# Patient Record
Sex: Female | Born: 1955 | Race: Black or African American | Hispanic: No | Marital: Single | Smoking: Former smoker
Health system: Southern US, Community
[De-identification: ages and names within clinical notes are randomized; demographics above are authoritative.]

## PROBLEM LIST (undated history)

## (undated) DIAGNOSIS — E119 Type 2 diabetes mellitus without complications: Secondary | ICD-10-CM

## (undated) DIAGNOSIS — B1920 Unspecified viral hepatitis C without hepatic coma: Secondary | ICD-10-CM

## (undated) DIAGNOSIS — M1990 Unspecified osteoarthritis, unspecified site: Secondary | ICD-10-CM

## (undated) DIAGNOSIS — M797 Fibromyalgia: Secondary | ICD-10-CM

## (undated) DIAGNOSIS — B009 Herpesviral infection, unspecified: Secondary | ICD-10-CM

## (undated) DIAGNOSIS — J449 Chronic obstructive pulmonary disease, unspecified: Secondary | ICD-10-CM

## (undated) HISTORY — PX: ABDOMINAL HYSTERECTOMY: SHX81

---

## 1998-08-22 ENCOUNTER — Emergency Department (HOSPITAL_COMMUNITY): Admission: EM | Admit: 1998-08-22 | Discharge: 1998-08-22 | Payer: Self-pay | Admitting: Emergency Medicine

## 2007-12-28 ENCOUNTER — Ambulatory Visit: Payer: Self-pay | Admitting: Gastroenterology

## 2008-04-02 ENCOUNTER — Ambulatory Visit: Payer: Self-pay | Admitting: Gastroenterology

## 2008-10-01 ENCOUNTER — Ambulatory Visit: Payer: Self-pay | Admitting: Gastroenterology

## 2014-02-19 ENCOUNTER — Emergency Department (HOSPITAL_BASED_OUTPATIENT_CLINIC_OR_DEPARTMENT_OTHER): Payer: Self-pay

## 2014-02-19 ENCOUNTER — Emergency Department (HOSPITAL_BASED_OUTPATIENT_CLINIC_OR_DEPARTMENT_OTHER)
Admission: EM | Admit: 2014-02-19 | Discharge: 2014-02-19 | Disposition: A | Discharge status: Home / Self Care | Payer: Self-pay | Attending: Emergency Medicine | Admitting: Emergency Medicine

## 2014-02-19 ENCOUNTER — Encounter (HOSPITAL_BASED_OUTPATIENT_CLINIC_OR_DEPARTMENT_OTHER): Payer: Self-pay | Admitting: Emergency Medicine

## 2014-02-19 DIAGNOSIS — Z791 Long term (current) use of non-steroidal anti-inflammatories (NSAID): Secondary | ICD-10-CM | POA: Insufficient documentation

## 2014-02-19 DIAGNOSIS — Z79899 Other long term (current) drug therapy: Secondary | ICD-10-CM | POA: Insufficient documentation

## 2014-02-19 DIAGNOSIS — R059 Cough, unspecified: Secondary | ICD-10-CM | POA: Insufficient documentation

## 2014-02-19 DIAGNOSIS — J441 Chronic obstructive pulmonary disease with (acute) exacerbation: Secondary | ICD-10-CM | POA: Insufficient documentation

## 2014-02-19 DIAGNOSIS — E119 Type 2 diabetes mellitus without complications: Secondary | ICD-10-CM | POA: Insufficient documentation

## 2014-02-19 DIAGNOSIS — R630 Anorexia: Secondary | ICD-10-CM | POA: Insufficient documentation

## 2014-02-19 DIAGNOSIS — Z8619 Personal history of other infectious and parasitic diseases: Secondary | ICD-10-CM | POA: Insufficient documentation

## 2014-02-19 DIAGNOSIS — F17200 Nicotine dependence, unspecified, uncomplicated: Secondary | ICD-10-CM | POA: Insufficient documentation

## 2014-02-19 DIAGNOSIS — Z792 Long term (current) use of antibiotics: Secondary | ICD-10-CM | POA: Insufficient documentation

## 2014-02-19 DIAGNOSIS — R51 Headache: Secondary | ICD-10-CM | POA: Insufficient documentation

## 2014-02-19 DIAGNOSIS — IMO0002 Reserved for concepts with insufficient information to code with codable children: Secondary | ICD-10-CM | POA: Insufficient documentation

## 2014-02-19 DIAGNOSIS — R05 Cough: Secondary | ICD-10-CM | POA: Insufficient documentation

## 2014-02-19 DIAGNOSIS — R61 Generalized hyperhidrosis: Secondary | ICD-10-CM | POA: Insufficient documentation

## 2014-02-19 HISTORY — DX: Type 2 diabetes mellitus without complications: E11.9

## 2014-02-19 HISTORY — DX: Unspecified viral hepatitis C without hepatic coma: B19.20

## 2014-02-19 HISTORY — DX: Herpesviral infection, unspecified: B00.9

## 2014-02-19 HISTORY — DX: Chronic obstructive pulmonary disease, unspecified: J44.9

## 2014-02-19 MED ORDER — PREDNISONE 50 MG PO TABS: 50.0000 mg | ORAL_TABLET | Freq: Every day | ORAL | Status: DC

## 2014-02-19 MED ORDER — AZITHROMYCIN 250 MG PO TABS: ORAL_TABLET | ORAL | Status: DC

## 2014-02-19 MED ORDER — ALBUTEROL SULFATE HFA 108 (90 BASE) MCG/ACT IN AERS
1.0000 | INHALATION_SPRAY | Freq: Four times a day (QID) | RESPIRATORY_TRACT | Status: AC | PRN
Start: 2014-02-19 — End: ?

## 2014-02-19 NOTE — ED Notes (Signed)
Cough x 3 weeks with low grade fever

## 2014-02-19 NOTE — Discharge Instructions (Signed)

## 2014-02-19 NOTE — ED Provider Notes (Signed)
CSN: [id number]     Arrival date & time 02/19/14  2007 History  This chart was scribed for Mirian Mo, MD by Roxy Cedar, ED Scribe. This patient was seen in room MH04/MH04 and the patient's care was started at 10:13 PM.   Chief Complaint  Patient presents with  . Cough   Patient is a 58 y.o. female presenting with cough. The history is provided by the patient. No language interpreter was used.  Cough Cough characteristics:  Productive Severity:  Moderate Onset quality:  Gradual Duration:  1 week Timing:  Intermittent Progression:  Unchanged Smoker: yes   Context: smoke exposure   Relieved by:  Nothing Worsened by:  Nothing tried Ineffective treatments:  None tried Associated symptoms: chills, diaphoresis, headaches, rhinorrhea, sinus congestion and sore throat    HPI Comments: Rhonda Mckee is a 58 y.o. female with a history of seasonal allergies and sinusitis, COPD, diabetes, hepatitis C, herpes, and abdominal hysterectomy, who presents to the Emergency Department complaining of intermittent congestion, sore throat, cough and headaches that began 3 weeks ago and worsened in the past week. She reports associated change in appetite, sweating, chills, rhinorrhea, and soreness in ribs. Patient states that she currently smokes. Patient currently takes Symbicort medication. Patient does not have Albuterol at home.  Past Medical History  Diagnosis Date  . COPD (chronic obstructive pulmonary disease)   . Diabetes mellitus without complication   . Hepatitis C   . Herpes    Past Surgical History  Procedure Laterality Date  . Abdominal hysterectomy     No family history on file. History  Substance Use Topics  . Smoking status: Current Every Day Smoker    Types: Cigarettes  . Smokeless tobacco: Never Used  . Alcohol Use: No   OB History   Grav Para Term Preterm Abortions TAB SAB Ect Mult Living                 Review of Systems  Constitutional: Positive for chills,  diaphoresis and appetite change.  HENT: Positive for rhinorrhea and sore throat.   Respiratory: Positive for cough.   Neurological: Positive for headaches.  All other systems reviewed and are negative.  Allergies  Review of patient's allergies indicates no known allergies.  Home Medications   Prior to Admission medications   Medication Sig Start Date End Date Taking? Authorizing Provider  acyclovir (ZOVIRAX) 400 MG tablet Take 400 mg by mouth daily.   Yes Historical Provider, MD  atorvastatin (LIPITOR) 10 MG tablet Take 10 mg by mouth daily.   Yes Historical Provider, MD  busPIRone (BUSPAR) 10 MG tablet Take 20 mg by mouth 3 (three) times daily.   Yes Historical Provider, MD  diclofenac (VOLTAREN) 50 MG EC tablet Take 50 mg by mouth 3 (three) times daily.   Yes Historical Provider, MD  fesoterodine (TOVIAZ) 8 MG TB24 tablet Take 8 mg by mouth daily.   Yes Historical Provider, MD  ibuprofen (ADVIL,MOTRIN) 800 MG tablet Take 800 mg by mouth every 8 (eight) hours as needed.   Yes Historical Provider, MD  lisinopril (PRINIVIL,ZESTRIL) 20 MG tablet Take 20 mg by mouth daily.   Yes Historical Provider, MD  metFORMIN (GLUCOPHAGE) 500 MG tablet Take 500 mg by mouth 2 (two) times daily with a meal.   Yes Historical Provider, MD  albuterol (PROVENTIL HFA;VENTOLIN HFA) 108 (90 BASE) MCG/ACT inhaler Inhale 1-2 puffs into the lungs every 6 (six) hours as needed for wheezing or shortness of breath. 02/19/14  Mirian Mo, MD  azithromycin (ZITHROMAX Z-PAK) 250 MG tablet Take 2 tabs PO day 1 followed by 1 tab PO daily x 4 days 02/19/14   Mirian Mo, MD  predniSONE (DELTASONE) 50 MG tablet Take 1 tablet (50 mg total) by mouth daily. 02/19/14   Mirian Mo, MD   Triage Vitals: BP 134/86  Pulse 88  Temp(Src) 98.9 F (37.2 C) (Oral)  Resp 20  Ht  (1.575 m)  Wt 180 lb (81.647 kg)  BMI 32.91 kg/m2  SpO2 95%  Physical Exam  Nursing note and vitals reviewed. Constitutional: She is oriented  to person, place, and time. She appears well-developed and well-nourished. No distress.  HENT:  Head: Normocephalic and atraumatic.  Eyes: Conjunctivae and EOM are normal.  Neck: Neck supple. No tracheal deviation present.  Cardiovascular: Normal rate, regular rhythm and normal heart sounds.  Exam reveals no gallop and no friction rub.   No murmur heard. Pulmonary/Chest: Effort normal and breath sounds normal. No respiratory distress.  Abdominal: Soft. Bowel sounds are normal. There is no tenderness.  Musculoskeletal: Normal range of motion.  Neurological: She is alert and oriented to person, place, and time.  Skin: Skin is warm and dry.  Psychiatric: She has a normal mood and affect. Her behavior is normal.   ED Course  Procedures (including critical care time)  DIAGNOSTIC STUDIES: Oxygen Saturation is 95% on RA, adequate by my interpretation.    COORDINATION OF CARE: 10:17 PM- Will give antibiotic and steroid medication to help relieve COPD symptoms. Pt advised of plan for treatment and pt agrees.  Labs Review Labs Reviewed - No data to display  Imaging Review Dg Chest 2 View  02/19/2014   CLINICAL DATA:  Cough, sore throat, weakness  EXAM: CHEST  2 VIEW  COMPARISON:  01/02/2014  FINDINGS: The heart size and mediastinal contours are within normal limits. Both lungs are clear. The visualized skeletal structures are unremarkable. Stable minimal prominence of the left first costochondral junction.  IMPRESSION: No active cardiopulmonary disease.   Electronically Signed   By: Christiana Pellant M.D.   On: 02/19/2014 21:05    EKG Interpretation None     MDM   Final diagnoses:  COPD exacerbation    58 y.o. female with pertinent PMH of COPD, DM presents with cough as described above consistent with COPD exacerbation.  No fevers, vomiting, or chest pain, and no increase in baseline dyspnea.  Physical exam as above benign.  Likely COPD exacerbation.  DC home with standard return  precautions, albuterol, prednisone course, and instructions to dc smoking.    1. COPD exacerbation       I personally performed the services described in this documentation, which was scribed in my presence. The recorded information has been reviewed and is accurate.  Mirian Mo, MD 02/20/14 [phone]

## 2014-06-09 ENCOUNTER — Emergency Department (HOSPITAL_BASED_OUTPATIENT_CLINIC_OR_DEPARTMENT_OTHER): Payer: Self-pay

## 2014-06-09 ENCOUNTER — Encounter (HOSPITAL_BASED_OUTPATIENT_CLINIC_OR_DEPARTMENT_OTHER): Payer: Self-pay | Admitting: *Deleted

## 2014-06-09 ENCOUNTER — Emergency Department (HOSPITAL_BASED_OUTPATIENT_CLINIC_OR_DEPARTMENT_OTHER)
Admission: EM | Admit: 2014-06-09 | Discharge: 2014-06-09 | Disposition: A | Discharge status: Home / Self Care | Payer: Self-pay | Attending: Emergency Medicine | Admitting: Emergency Medicine

## 2014-06-09 DIAGNOSIS — J449 Chronic obstructive pulmonary disease, unspecified: Secondary | ICD-10-CM | POA: Insufficient documentation

## 2014-06-09 DIAGNOSIS — Y998 Other external cause status: Secondary | ICD-10-CM | POA: Insufficient documentation

## 2014-06-09 DIAGNOSIS — W19XXXA Unspecified fall, initial encounter: Secondary | ICD-10-CM

## 2014-06-09 DIAGNOSIS — Y9289 Other specified places as the place of occurrence of the external cause: Secondary | ICD-10-CM | POA: Insufficient documentation

## 2014-06-09 DIAGNOSIS — Z7952 Long term (current) use of systemic steroids: Secondary | ICD-10-CM | POA: Insufficient documentation

## 2014-06-09 DIAGNOSIS — Z79899 Other long term (current) drug therapy: Secondary | ICD-10-CM | POA: Insufficient documentation

## 2014-06-09 DIAGNOSIS — W109XXA Fall (on) (from) unspecified stairs and steps, initial encounter: Secondary | ICD-10-CM | POA: Insufficient documentation

## 2014-06-09 DIAGNOSIS — Z791 Long term (current) use of non-steroidal anti-inflammatories (NSAID): Secondary | ICD-10-CM | POA: Insufficient documentation

## 2014-06-09 DIAGNOSIS — Y9301 Activity, walking, marching and hiking: Secondary | ICD-10-CM | POA: Insufficient documentation

## 2014-06-09 DIAGNOSIS — S20211A Contusion of right front wall of thorax, initial encounter: Secondary | ICD-10-CM | POA: Insufficient documentation

## 2014-06-09 DIAGNOSIS — Z8619 Personal history of other infectious and parasitic diseases: Secondary | ICD-10-CM | POA: Insufficient documentation

## 2014-06-09 DIAGNOSIS — Z720 Tobacco use: Secondary | ICD-10-CM | POA: Insufficient documentation

## 2014-06-09 DIAGNOSIS — Y92009 Unspecified place in unspecified non-institutional (private) residence as the place of occurrence of the external cause: Secondary | ICD-10-CM

## 2014-06-09 DIAGNOSIS — E119 Type 2 diabetes mellitus without complications: Secondary | ICD-10-CM | POA: Insufficient documentation

## 2014-06-09 MED ORDER — OXYCODONE-ACETAMINOPHEN 5-325 MG PO TABS: ORAL_TABLET | ORAL | Status: DC

## 2014-06-09 MED ORDER — OXYCODONE-ACETAMINOPHEN 5-325 MG PO TABS
1.0000 | ORAL_TABLET | Freq: Once | ORAL | Status: AC
  Administered 2014-06-09: 1 via ORAL
  Filled 2014-06-09: qty 1

## 2014-06-09 NOTE — ED Notes (Signed)
Tuesday pt states she fell down 8 steps and has had right side pain since.

## 2014-06-09 NOTE — Discharge Instructions (Signed)
Take percocet for breakthrough pain, do not drink alcohol, drive, care for children or do other critical tasks while taking percocet.   It is very important that you take deep breaths to prevent lung collapse and infection.  Take 10 deep breaths every hour to prevent lung collapse.  If you develop cough, fever or shortness of breath return immediately to the emergency room.  Do not hesitate to return to the emergency room for any new, worsening or concerning symptoms.  Please obtain primary care using resource guide below. But the minute you were seen in the emergency room and that they will need to obtain records for further outpatient management.   Rib Contusion A rib contusion (bruise) can occur by a blow to the chest or by a fall against a hard object. Usually these will be much better in a couple weeks. If X-rays were taken today and there are no broken bones (fractures), the diagnosis of bruising is made. However, broken ribs may not show up for several days, or may be discovered later on a routine X-ray when signs of healing show up. If this happens to you, it does not mean that something was missed on the X-ray, but simply that it did not show up on the first X-rays. Earlier diagnosis will not usually change the treatment. HOME CARE INSTRUCTIONS   Avoid strenuous activity. Be careful during activities and avoid bumping the injured ribs. Activities that pull on the injured ribs and cause pain should be avoided, if possible.  For the first day or two, an ice pack used every 20 minutes while awake may be helpful. Put ice in a plastic bag and put a towel between the bag and the skin.  Eat a normal, well-balanced diet. Drink plenty of fluids to avoid constipation.  Take deep breaths several times a day to keep lungs free of infection. Try to cough several times a day. Splint the injured area with a pillow while coughing to ease pain. Coughing can help prevent pneumonia.  Wear a rib belt  or binder only if told to do so by your caregiver. If you are wearing a rib belt or binder, you must do the breathing exercises as directed by your caregiver. If not used properly, rib belts or binders restrict breathing which can lead to pneumonia.  Only take over-the-counter or prescription medicines for pain, discomfort, or fever as directed by your caregiver. SEEK MEDICAL CARE IF:   You or your child has an oral temperature above 102 F (38.9 C).  Your baby is older than 3 months with a rectal temperature of 100.5 F (38.1 C) or higher for more than 1 day.  You develop a cough, with thick or bloody sputum. SEEK IMMEDIATE MEDICAL CARE IF:   You have difficulty breathing.  You feel sick to your stomach (nausea), have vomiting or belly (abdominal) pain.  You have worsening pain, not controlled with medications, or there is a change in the location of the pain.  You develop sweating or radiation of the pain into the arms, jaw or shoulders, or become light headed or faint.  You or your child has an oral temperature above 102 F (38.9 C), not controlled by medicine.  Your or your baby is older than 3 months with a rectal temperature of 102 F (38.9 C) or higher.  Your baby is 36 months old or younger with a rectal temperature of 100.4 F (38 C) or higher. MAKE SURE YOU:   Understand these instructions.  Will watch your condition.  Will get help right away if you are not doing well or get worse. Document Released: 02/09/2001 Document Revised: 09/11/2012 Document Reviewed: 01/03/2008 [facility] Patient Information 2015 [place], [place]. This information is not intended to replace advice given to you by your health care provider. Make sure you discuss any questions you have with your health care provider.    Emergency Department Resource Guide 1) Find a Doctor and Pay Out of Pocket Although you won't have to find out who is covered by your insurance plan, it is a good idea to ask  around and get recommendations. You will then need to call the office and see if the doctor you have chosen will accept you as a new patient and what types of options they offer for patients who are self-pay. Some doctors offer discounts or will set up payment plans for their patients who do not have insurance, but you will need to ask so you aren't surprised when you get to your appointment.  2) Contact Your Local Health Department Not all health departments have doctors that can see patients for sick visits, but many do, so it is worth a call to see if yours does. If you don't know where your local health department is, you can check in your phone book. The CDC also has a tool to help you locate your state's health department, and many state websites also have listings of all of their local health departments.  3) Find a Walk-in Clinic If your illness is not likely to be very severe or complicated, you may want to try a walk in clinic. These are popping up all over the country in pharmacies, drugstores, and shopping centers. They're usually staffed by nurse practitioners or physician assistants that have been trained to treat common illnesses and complaints. They're usually fairly quick and inexpensive. However, if you have serious medical issues or chronic medical problems, these are probably not your best option.  No Primary Care Doctor: - Call Health Connect at  [phone] - they can help you locate a primary care doctor that  accepts your insurance, provides certain services, etc. - Physician Referral Service- [phone]  Chronic Pain Problems: Organization         Address  Phone   Notes  [facility]  [phone] Patients need to be referred by their primary care doctor.   Medication Assistance: Organization         Address  Phone   Notes  [facility] Medication [facility] [address] [place], [place] [address] [phone] --Must be a  resident of [facility] -- Must have NO insurance coverage whatsoever (no Medicaid/ Medicare, etc.) -- The pt. MUST have a primary care doctor that directs their care regularly and follows them in the community   [facility]  [phone]   [employer]  [phone]    Agencies that provide inexpensive medical care: Organization         Address  Phone   Notes  [facility]  [phone]   [facility]    [phone]   [facility] [address] [place], [place] [address] [phone]   [facility] [address] [place] [place] [phone]   Planned Parenthood    [phone]   [facility]    [phone]   [facility]  [address], [employer] Phone:  [phone], Fax:  [phone]) [phone] Hours of Operation:  9 am - 6 pm, M-F.  Also accepts Medicaid/Medicare and self-pay.  [facility]  [address], [place] Phone: [phone](336) [phone], Fax: [phone](336) [phone]. Hours of Operation:  8:30 am - 5:30 pm, M-F.  Also accepts Medicaid and self-pay.  [facility] [place] [address], [place] Phone: [phone]) [phone]   [facility] [address], [place], [place] [phone], Ext. [phone] Mondays & Thursdays: 7-9 AM.  First 15 patients are seen on a first come, first serve basis.    Medicaid-accepting [facility] Providers:  Organization         Address  Phone   Notes  [facility] [address], [place] [phone]) [phone] Also accepts self-pay patients.  [facility] [address] [place], [place]  [phone]) [phone]   [facility] [address], [place] [phone]) [phone]   [facility] [address] [place] [address], [place] [phone]) [phone]   Renaye RakersVeita Bland [address], [place]   [phone](336) [phone] Only accepts  [place] Access [place] patients after they have their name applied to their card.   Self-Pay (no insurance) in [facility] [place]:  Organization         Address  Phone   Notes  Sickle Cell Patients, [facility] [address], [place] [phone]) [phone]   [facility] [address], [place] [phone]) [phone]   [facility]  [address],  [phone]) [phone]   [facility]/Dr. Osei-Bonsu  [address] [place] [address], [place] or [phone], [place] [phone](336) [phone] Phone number for both [place] and [place] locations is the same.  [facility] [address], [place] [phone]   [facility] [place] [address] [place] [address], [place] or [address] [phone](336) [phone]) [phone]   [facility] [address], [place] [phone](336) [phone], phone; [phone](336) [phone], fax Sees patients 1st and 3rd Saturday of every month.  Must not qualify for public or private insurance (i.e. Medicaid, Medicare, [place] Health Choice, Veterans' Benefits)  Household income should be no more than 200% of the poverty level The clinic cannot treat you if you are pregnant or think you are pregnant  Sexually transmitted diseases are not treated at the clinic.    Dental Care: Organization         Address  Phone  Notes  [facility] Department of [facility] [address], [place] [phone] Accepts children up to age 57 who are enrolled in [place] or [place] Health Choice; pregnant women with a Medicaid card; and children who have applied for Medicaid or [place] Health Choice, but were declined, whose parents can pay a reduced fee at time of service.  [facility] of [facility] [place]  [address], [place] [phone]) [phone] Accepts children up to age 57 who are enrolled in [place] or [place] Health Choice; pregnant women  with a Medicaid card; and children who have applied for Medicaid or [place] Health Choice, but were declined, whose parents can pay a reduced fee at time of service.  [facility]  [address], [place] [phone]) [phone] Patients are seen  by appointment only. Walk-ins are not accepted. [facility] will see patients 56 years of age and older. Monday - Tuesday (8am-5pm) Most Wednesdays (8:30-5pm) $30 per visit, cash only  [facility] Adult Dental Access PROGRAM  [address], [facility] [phone] Patients are seen by appointment only. Walk-ins are not accepted. [facility] will see patients 1 years of age and older. One Wednesday Evening (Monthly: Volunteer Based).  $30 per visit, cash only  [employer]  [phone] for adults; Children under age 23, call Graduate Pediatric Dentistry at [phone]. Children aged 31-14, please call [phone] to request a pediatric application.  Dental services are provided in all areas of dental care including fillings, crowns and bridges, complete and partial dentures, implants, gum treatment, root canals, and extractions. Preventive care is also provided. Treatment is provided to both adults and children. Patients are selected via a lottery and there is often a waiting list.   [facility] [address] [place]  [phone] [email or web address]   [facility] [address] [place], [place] [phone], Ext. [phone] Second and Fourth Thursday of each month, opens at 6:30 AM; Clinic ends at 9 AM.  Patients are seen on a first-come first-served basis, and a limited number are seen during each clinic.   [facility]  [address] [place], [place] [phone]   Eligibility Requirements You must have lived in [place], [place], or [place] counties for at least the last three months.   You cannot be eligible for state or federal sponsored [employer], including [employer], [place], or [employer].   You generally cannot be eligible for healthcare insurance through your employer.    How to apply: Eligibility screenings are held every Tuesday and Wednesday afternoon from 1:00 pm until 4:00 pm. You do not need an appointment for the interview!  [facility] [address] [place], [place] [phone]   [facility] Health Department  [phone]   [facility] Health Department  [phone]   [facility] Health Department  [phone]    Behavioral Health Resources in the Community: Intensive Outpatient Programs Organization         Address  Phone  Notes  [facility] [address] [place], [place] [phone]   [facility] Outpatient [address] [place], [place] [phone]   [facility] [address], [place], [place]  [phone]   [facility] [address],  [place], [place] [phone] or [phone]   Substance Abuse Resources Organization         Address  Phone  Notes  [facility] Drug [facility]  [phone]   [facility]  [phone]   [facility] [place]  [phone]   Floydene Flock  [phone]   Residential & Outpatient Substance Abuse Program  [phone]   Psychological Services Organization         Address  Phone  Notes  [facility] Behavioral Health  [phone]   [facility]  [phone]   [facility] Mental Health [address] [place] [phone] or [phone]    Mobile Crisis Teams Organization         Address  Phone  Notes  [facility], Mobile Crisis Care Unit  [phone]   [facility]  [address] [place], [place] [phone]   [facility] [address] [place] [phone]  Self-Help/Support Groups Organization         Address  Phone             Notes  Mental  [facility] [place] - variety of support groups  336- I7437963 Call for more information  Narcotics Anonymous (NA), [facility]aring [facility] [address], [employer] [place]  2 meetings at this location   Statistician         Address  Phone  Notes  [facility] [address],    [place]  [phone]   [facility]  [address] [place] [address], [place], [place] [phone]   [facility] [address] [place], [place] [phone] Admissions: 8am-3pm M-F  [facility] [address],    [place], [place] [phone]   [facility] [address] [place], [place], [place] [phone]   [facility] [address],  [place], [place] [phone]   [facility] - Intensive Outpatient [address], [place], [place] [phone]   [facility] ([facility] [address] [place]  [place], [place] [phone] or [phone]   [facility] ([facility]) [address], [place], [place] [phone] Accepts Medicaid  [facility] [address],  [place] [phone] Substance Abuse/Addiction Treatment   [facility] Organization         Address  Phone  Notes  [facility]  [phone]   Angie Fava, PhD [address] [place], [place]   [phone] or [phone]   [facility]al   [address] [place], [place] [phone]   [facility] [address] [place], [place] [phone] Insurance/Medicaid/sponsorship through [facility] and Families [address]                                    [place], [place] [phone] Therapy/tele-psych/case  [facility] [address], [place] [phone]    Dr. Lolly Mustache  [phone]   [facility] [place]  [employer] [facility] Dept. 1) [address]  [place] 2) [address], [place] 3)  [address], [place] [phone]  8636222121   [facility] Child Abuse Hotline 423-472-5244 or [phone] (After Hours)

## 2014-06-09 NOTE — ED Provider Notes (Signed)
CSN: [id number]     Arrival date & time 06/09/14  1414 History   First MD Initiated Contact with Patient 06/09/14 1537     Chief Complaint  Patient presents with  . Fall     (Consider location/radiation/quality/duration/timing/severity/associated sxs/prior Treatment) HPI   Rhonda Mckee is a 59 y.o. female complaining of right rib pain rated 8 out of 10, minimally alleviated by Aleve status post mechanical slip and fall 6 days ago. Patient states that she tripped over her nightgown walking down steps and slipped. States she slid down 8 steps there was head trauma to the right parietal area, she is not anticoagulated, there was no loss of consciousness, no nausea vomiting, no change in vision, dysarthria, ataxia, cervicalgia, abdominal pain or difficulty moving major joints.  Past Medical History  Diagnosis Date  . COPD (chronic obstructive pulmonary disease)   . Diabetes mellitus without complication   . Hepatitis C   . Herpes    Past Surgical History  Procedure Laterality Date  . Abdominal hysterectomy     No family history on file. History  Substance Use Topics  . Smoking status: Current Every Day Smoker    Types: Cigarettes  . Smokeless tobacco: Never Used  . Alcohol Use: No   OB History    No data available     Review of Systems  10 systems reviewed and found to be negative, except as noted in the HPI.   Allergies  Review of patient's allergies indicates no known allergies.  Home Medications   Prior to Admission medications   Medication Sig Start Date End Date Taking? Authorizing Provider  acyclovir (ZOVIRAX) 400 MG tablet Take 400 mg by mouth daily.    Historical Provider, MD  albuterol (PROVENTIL HFA;VENTOLIN HFA) 108 (90 BASE) MCG/ACT inhaler Inhale 1-2 puffs into the lungs every 6 (six) hours as needed for wheezing or shortness of breath. 02/19/14   Mirian Mo, MD  atorvastatin (LIPITOR) 10 MG tablet Take 10 mg by mouth daily.    Historical Provider, MD    azithromycin (ZITHROMAX Z-PAK) 250 MG tablet Take 2 tabs PO day 1 followed by 1 tab PO daily x 4 days 02/19/14   Mirian Mo, MD  busPIRone (BUSPAR) 10 MG tablet Take 20 mg by mouth 3 (three) times daily.    Historical Provider, MD  diclofenac (VOLTAREN) 50 MG EC tablet Take 50 mg by mouth 3 (three) times daily.    Historical Provider, MD  fesoterodine (TOVIAZ) 8 MG TB24 tablet Take 8 mg by mouth daily.    Historical Provider, MD  ibuprofen (ADVIL,MOTRIN) 800 MG tablet Take 800 mg by mouth every 8 (eight) hours as needed.    Historical Provider, MD  lisinopril (PRINIVIL,ZESTRIL) 20 MG tablet Take 20 mg by mouth daily.    Historical Provider, MD  metFORMIN (GLUCOPHAGE) 500 MG tablet Take 500 mg by mouth 2 (two) times daily with a meal.    Historical Provider, MD  oxyCODONE-acetaminophen (PERCOCET/ROXICET) 5-325 MG per tablet 1 to 2 tabs PO q6hrs  PRN for pain 06/09/14   Joni Reining Miqueas Whilden, PA-C  predniSONE (DELTASONE) 50 MG tablet Take 1 tablet (50 mg total) by mouth daily. 02/19/14   Mirian Mo, MD   BP 153/87 mmHg  Pulse 81  Temp(Src) 98.3 F (36.8 C) (Oral)  Resp 18  Ht  (1.575 m)  Wt 176 lb (79.833 kg)  BMI 32.18 kg/m2  SpO2 100%  LMP  Physical Exam  Constitutional: She is oriented to person, place,  and time. She appears well-developed and well-nourished.  HENT:  Head: Normocephalic and atraumatic.    Mouth/Throat: Oropharynx is clear and moist.  No abrasions or contusions.   No hemotympanum, battle signs or raccoon's eyes  No crepitance or tenderness to palpation along the orbital rim.  EOMI intact with no pain or diplopia  No abnormal otorrhea or rhinorrhea. Nasal septum midline.  No intraoral trauma.  Eyes: Conjunctivae and EOM are normal. Pupils are equal, round, and reactive to light.  Neck: Normal range of motion. Neck supple.  No midline C-spine  tenderness to palpation or step-offs appreciated. Patient has full range of motion without pain.    Cardiovascular: Normal rate, regular rhythm and intact distal pulses.   Pulmonary/Chest: Effort normal and breath sounds normal. No respiratory distress. She has no wheezes. She has no rales. She exhibits tenderness.    + TTP   No crepitance  Abdominal: Soft. Bowel sounds are normal. She exhibits no distension and no mass. There is no tenderness. There is no rebound and no guarding.  Musculoskeletal: Normal range of motion. She exhibits no edema or tenderness.  Pelvis stable. No deformity or TTP of major joints.   Good ROM  Neurological: She is alert and oriented to person, place, and time.  Follows commands, Clear, goal oriented speech, Strength is 5 out of 5x4 extremities, patient ambulates with a coordinated in nonantalgic gait. Sensation is grossly intact.   Skin: Skin is warm.  Psychiatric: She has a normal mood and affect.  Nursing note and vitals reviewed.   ED Course  Procedures (including critical care time) Labs Review Labs Reviewed - No data to display  Imaging Review Dg Chest 2 View  06/09/2014   CLINICAL DATA:  Patient fell 5 days ago landing on right side. Persistent pain.  EXAM: CHEST  2 VIEW  COMPARISON:  February 19, 2014  FINDINGS: There is no edema or consolidation. Heart size and pulmonary vascularity are normal. No adenopathy. There are exostoses arising from each anterior first rib, larger on the left than on the right, stable. No acute fracture seen. No pneumothorax.  IMPRESSION: No edema or consolidation.  No demonstrable pneumothorax.   Electronically Signed   By: Bretta BangWilliam  Woodruff M.D.   On: 06/09/2014 15:21     EKG Interpretation None      MDM   Final diagnoses:  Rib contusion, right, initial encounter  Fall at home, initial encounter   Filed Vitals:   06/09/14 1423 06/09/14 1552  BP: 153/87   Pulse: 81   Temp: 98.3 F (36.8 C)   TempSrc: Oral   Resp: 18   Height:  5\' 2"  (1.575 m)  Weight:  176 lb (79.833 kg)  SpO2: 100%      Medications  oxyCODONE-acetaminophen (PERCOCET/ROXICET) 5-325 MG per tablet 1 tablet (not administered)    Rhonda Mckee is a pleasant 59 y.o. female presenting with right midaxillary pain status post slip and fall. X-ray without fracture or pneumothorax. No crepitance, excellent air movement in all fields. Patient also had head trauma at that time. She's not anticoagulated, neuro exam is nonfocal, there is no cervicalgia, no indication for neuroimaging at this time. Patient will be given incentive spirometer, recommend Percocet for pain control. 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 I will give her sections to take deep breaths to prevent atelectasis.  Evaluation does not show pathology that would require ongoing emergent intervention or inpatient treatment. Pt is hemodynamically stable  and mentating appropriately. Discussed findings and plan with patient/guardian, who agrees with care plan. All questions answered. Return precautions discussed and outpatient follow up given.   New Prescriptions   OXYCODONE-ACETAMINOPHEN (PERCOCET/ROXICET) 5-325 MG PER TABLET    1 to 2 tabs PO q6hrs  PRN for pain        Wynetta Emery, PA-C 06/09/14 1621  Vida Roller, MD 06/09/14 [phone]

## 2015-09-26 ENCOUNTER — Encounter (HOSPITAL_BASED_OUTPATIENT_CLINIC_OR_DEPARTMENT_OTHER): Payer: Self-pay | Admitting: *Deleted

## 2015-09-26 ENCOUNTER — Emergency Department (HOSPITAL_BASED_OUTPATIENT_CLINIC_OR_DEPARTMENT_OTHER)
Admission: EM | Admit: 2015-09-26 | Discharge: 2015-09-26 | Disposition: A | Discharge status: Home / Self Care | Payer: Medicaid Other | Attending: Emergency Medicine | Admitting: Emergency Medicine

## 2015-09-26 ENCOUNTER — Emergency Department (HOSPITAL_BASED_OUTPATIENT_CLINIC_OR_DEPARTMENT_OTHER): Payer: Medicaid Other

## 2015-09-26 DIAGNOSIS — M25521 Pain in right elbow: Secondary | ICD-10-CM

## 2015-09-26 DIAGNOSIS — R2231 Localized swelling, mass and lump, right upper limb: Secondary | ICD-10-CM | POA: Insufficient documentation

## 2015-09-26 DIAGNOSIS — E119 Type 2 diabetes mellitus without complications: Secondary | ICD-10-CM | POA: Diagnosis not present

## 2015-09-26 DIAGNOSIS — F17210 Nicotine dependence, cigarettes, uncomplicated: Secondary | ICD-10-CM | POA: Diagnosis not present

## 2015-09-26 DIAGNOSIS — J449 Chronic obstructive pulmonary disease, unspecified: Secondary | ICD-10-CM | POA: Insufficient documentation

## 2015-09-26 MED ORDER — SULFAMETHOXAZOLE-TRIMETHOPRIM 800-160 MG PO TABS: 1.0000 | ORAL_TABLET | Freq: Two times a day (BID) | ORAL | Status: AC

## 2015-09-26 NOTE — Discharge Instructions (Signed)
Please read and follow all provided instructions.  Your diagnoses today include:  1. Elbow pain, right     Tests performed today include:  An x-ray of the affected area - does NOT show any broken bones but does show 2 areas of swelling of uncertain cause  Vital signs. See below for your results today.   Medications prescribed:   Bactrim (trimethoprim/sulfamethoxazole) - antibiotic  You have been prescribed an antibiotic medicine: take the entire course of medicine even if you are feeling better. Stopping early can cause the antibiotic not to work.  Take any prescribed medications only as directed.  Home care instructions:   Follow any educational materials contained in this packet  Follow R.I.C.E. Protocol:  R - rest your injury   I  - use ice on injury without applying directly to skin  C - compress injury with bandage or splint  E - elevate the injury as much as possible  Follow-up instructions: Please follow-up with your primary care provider or the provided orthopedic physician (bone specialist) in 3-5 days.   Return instructions:   Please return if your fingers are numb or tingling, appear gray or blue, or you have severe pain (also elevate the arm and loosen splint or wrap if you were given one)  Return with worsening pain, redness, swelling of your elbow.   Please return to the Emergency Department if you experience worsening symptoms.   Please return if you have any other emergent concerns.  Additional Information:  Your vital signs today were: BP 112/64 mmHg   Pulse 94   Temp(Src) 98.9 F (37.2 C) (Oral)   Resp 18   Ht 5\' 2"  (1.575 m)   Wt 77.111 kg   BMI 31.09 kg/m2   SpO2 100% If your blood pressure (BP) was elevated above 135/85 this visit, please have this repeated by your doctor within one month. --------------

## 2015-09-26 NOTE — ED Notes (Signed)
Pt reports lump in the elbow of her right arm since noon today.  Reports pain, denies injury.

## 2015-09-26 NOTE — ED Provider Notes (Signed)
CSN: [id number]     Arrival date & time 09/26/15  2003 History   By signing my name below, I, Arlan Organ, attest that this documentation has been prepared under the direction and in the presence of Josh Haila Dena PA-C.  Electronically Signed: Arlan Organ, ED Scribe. 09/26/2015. 9:36 PM.   Chief Complaint  Patient presents with  . Mass   The history is provided by the patient. No language interpreter was used.    HPI Comments: Rhonda Mckee is a 60 y.o. female with a PMHx of COPD and DM who presents to the Emergency Department here for a noted painful "lump" to the R arm which initially appeared earlier this afternoon at approximately noon. No recent falls, injury, or trauma. No aggravating or alleviating factors at this time. No home remedies attempted prior to arrival. No recent fever or chills. She denies any bleeding disorders. No prior history of same. Pt takes Gabapentin daily. No known allergies to medications.  PCP: Lynn Ito, MD    Past Medical History  Diagnosis Date  . COPD (chronic obstructive pulmonary disease) (HCC)   . Diabetes mellitus without complication (HCC)   . Hepatitis C     took treatment  . Herpes    Past Surgical History  Procedure Laterality Date  . Abdominal hysterectomy     History reviewed. No pertinent family history. Social History  Substance Use Topics  . Smoking status: Current Every Day Smoker    Types: Cigarettes  . Smokeless tobacco: Never Used  . Alcohol Use: No   OB History    No data available     Review of Systems  Constitutional: Negative for fever, chills and activity change.  Musculoskeletal: Positive for myalgias and arthralgias. Negative for back pain and joint swelling.  Skin: Negative for wound.  Neurological: Negative for weakness, numbness and headaches.  Psychiatric/Behavioral: Negative for confusion.   Allergies  Review of patient's allergies indicates no known allergies.  Home Medications   Prior to Admission  medications   Medication Sig Start Date End Date Taking? Authorizing Provider  acyclovir (ZOVIRAX) 400 MG tablet Take 400 mg by mouth daily.    Historical Provider, MD  albuterol (PROVENTIL HFA;VENTOLIN HFA) 108 (90 BASE) MCG/ACT inhaler Inhale 1-2 puffs into the lungs every 6 (six) hours as needed for wheezing or shortness of breath. 02/19/14   Mirian Mo, MD  atorvastatin (LIPITOR) 10 MG tablet Take 10 mg by mouth daily.    Historical Provider, MD  azithromycin (ZITHROMAX Z-PAK) 250 MG tablet Take 2 tabs PO day 1 followed by 1 tab PO daily x 4 days 02/19/14   Mirian Mo, MD  busPIRone (BUSPAR) 10 MG tablet Take 20 mg by mouth 3 (three) times daily.    Historical Provider, MD  diclofenac (VOLTAREN) 50 MG EC tablet Take 50 mg by mouth 3 (three) times daily.    Historical Provider, MD  fesoterodine (TOVIAZ) 8 MG TB24 tablet Take 8 mg by mouth daily.    Historical Provider, MD  ibuprofen (ADVIL,MOTRIN) 800 MG tablet Take 800 mg by mouth every 8 (eight) hours as needed.    Historical Provider, MD  lisinopril (PRINIVIL,ZESTRIL) 20 MG tablet Take 20 mg by mouth daily.    Historical Provider, MD  metFORMIN (GLUCOPHAGE) 500 MG tablet Take 500 mg by mouth 2 (two) times daily with a meal.    Historical Provider, MD  oxyCODONE-acetaminophen (PERCOCET/ROXICET) 5-325 MG per tablet 1 to 2 tabs PO q6hrs  PRN for pain 06/09/14  Nicole Pisciotta, PA-C  predniSONE (DELTASONE) 50 MG tablet Take 1 tablet (50 mg total) by mouth daily. 02/19/14   Mirian Mo, MD   Triage Vitals: BP 112/64 mmHg  Pulse 94  Temp(Src) 98.9 F (37.2 C) (Oral)  Resp 18  Ht  (1.575 m)  Wt 170 lb (77.111 kg)  BMI 31.09 kg/m2  SpO2 100%   Physical Exam  Constitutional: She is oriented to person, place, and time. She appears well-developed and well-nourished.  HENT:  Head: Normocephalic and atraumatic.  Eyes: EOM are normal. Pupils are equal, round, and reactive to light.  Neck: Normal range of motion. Neck supple.   Cardiovascular: Exam reveals no decreased pulses.   Pulmonary/Chest: Effort normal.  Abdominal: She exhibits no distension.  Musculoskeletal: Normal range of motion. She exhibits tenderness. She exhibits no edema.  1 small, approx 2cm diameter firm nodule felt in the soft tissues of the medial R elbow. Normal ROM of R elbow without pain. Distal circulation, motor, sensation grossly intact.   Neurological: She is alert and oriented to person, place, and time. No sensory deficit.  Motor, sensation, and vascular distal to the injury is fully intact.   Skin: Skin is warm and dry.  Psychiatric: She has a normal mood and affect.  Nursing note and vitals reviewed.   ED Course  Procedures (including critical care time)  DIAGNOSTIC STUDIES: Oxygen Saturation is 100% on RA, Normal by my interpretation.    COORDINATION OF CARE: 9:33 PM- Will order imaging. Discussed treatment plan with pt at bedside and pt agreed to plan.    Imaging Review Dg Elbow Complete Right  09/26/2015  CLINICAL DATA:  Acute onset of medial right elbow lump and pain. Initial encounter. EXAM: RIGHT ELBOW - COMPLETE 3+ VIEW COMPARISON:  None. FINDINGS: There is no evidence of fracture or dislocation. The visualized joint spaces are preserved. No significant joint effusion is identified. A chronic lateral epicondylar fragment is noted. Two relatively well defined soft tissue densities are noted tracking along the medial aspect of the right distal upper arm, measuring 2.5 cm and 2.1 cm. Would correlate with the patient's history as to whether these might reflect phlegmon or abscesses. IMPRESSION: Two relatively well defined soft tissue densities tracking along the medial aspect of the right distal upper arm, measuring 2.5 cm and 2.1 cm. Would correlate with the patient's history as to whether these might reflect phlegmon or abscesses. Electronically Signed   By: Roanna Raider M.D.   On: 09/26/2015 22:01   I have personally  reviewed and evaluated these images and lab results as part of my medical decision-making.  Imaging discussed with Dr. Ranae Palms who has seen patient. He has placed [place] on patient. Feels these areas are likely solid. Cannot entirely rule-out abscess. Will cover with bactrim. Patient has appt with PCP next week and will have recheck at that time.   11:23 PM The patient was urged to return to the Emergency Department urgently with worsening pain, swelling, expanding erythema especially if it streaks away from the affected area, fever, or if they have any other concerns.   The patient was urged to return to the Emergency Department in 48 hours for wound recheck if the area is not significantly improved.   The patient verbalized understanding and stated agreement with this plan.     MDM   Final diagnoses:  Elbow pain, right   Patient with elbow pain, subcutaneous nodules as noted on exam and x-ray. No systemic symptoms of illness.  Will cover for infection. Patient is without fever or tachycardia.  I personally performed the services described in this documentation, which was scribed in my presence. The recorded information has been reviewed and is accurate.   Renne CriglerJoshua Maymunah Stegemann, PA-C 09/26/15 2324  Loren Raceravid Yelverton, MD 09/30/15 [phone]

## 2015-09-26 NOTE — ED Notes (Signed)
Updated visitor to wait time for provider

## 2016-02-21 ENCOUNTER — Emergency Department (HOSPITAL_BASED_OUTPATIENT_CLINIC_OR_DEPARTMENT_OTHER): Payer: Medicare Other

## 2016-02-21 ENCOUNTER — Encounter (HOSPITAL_BASED_OUTPATIENT_CLINIC_OR_DEPARTMENT_OTHER): Payer: Self-pay | Admitting: Emergency Medicine

## 2016-02-21 ENCOUNTER — Emergency Department (HOSPITAL_BASED_OUTPATIENT_CLINIC_OR_DEPARTMENT_OTHER)
Admission: EM | Admit: 2016-02-21 | Discharge: 2016-02-21 | Disposition: A | Discharge status: Home / Self Care | Payer: Medicare Other | Attending: Emergency Medicine | Admitting: Emergency Medicine

## 2016-02-21 DIAGNOSIS — Y999 Unspecified external cause status: Secondary | ICD-10-CM | POA: Insufficient documentation

## 2016-02-21 DIAGNOSIS — S76311A Strain of muscle, fascia and tendon of the posterior muscle group at thigh level, right thigh, initial encounter: Secondary | ICD-10-CM | POA: Diagnosis not present

## 2016-02-21 DIAGNOSIS — E119 Type 2 diabetes mellitus without complications: Secondary | ICD-10-CM | POA: Diagnosis not present

## 2016-02-21 DIAGNOSIS — Z7984 Long term (current) use of oral hypoglycemic drugs: Secondary | ICD-10-CM | POA: Diagnosis not present

## 2016-02-21 DIAGNOSIS — F17210 Nicotine dependence, cigarettes, uncomplicated: Secondary | ICD-10-CM | POA: Insufficient documentation

## 2016-02-21 DIAGNOSIS — Y929 Unspecified place or not applicable: Secondary | ICD-10-CM | POA: Diagnosis not present

## 2016-02-21 DIAGNOSIS — J449 Chronic obstructive pulmonary disease, unspecified: Secondary | ICD-10-CM | POA: Diagnosis not present

## 2016-02-21 DIAGNOSIS — S8991XA Unspecified injury of right lower leg, initial encounter: Secondary | ICD-10-CM | POA: Diagnosis present

## 2016-02-21 DIAGNOSIS — Y9341 Activity, dancing: Secondary | ICD-10-CM | POA: Diagnosis not present

## 2016-02-21 DIAGNOSIS — X501XXA Overexertion from prolonged static or awkward postures, initial encounter: Secondary | ICD-10-CM | POA: Insufficient documentation

## 2016-02-21 DIAGNOSIS — Z7951 Long term (current) use of inhaled steroids: Secondary | ICD-10-CM | POA: Insufficient documentation

## 2016-02-21 MED ORDER — IBUPROFEN 200 MG PO TABS: 600.0000 mg | ORAL_TABLET | Freq: Once | ORAL | Status: DC

## 2016-02-21 NOTE — ED Notes (Signed)
R knee thigh pain, pinpoints to lateral knee/thigh, relates to twisiting while dancing 4 hrs ago, no relief with flexeril, no other meds taken, xray reviewed. Alert, NAD, calm, interactive, "worse with standing and ambulation".

## 2016-02-21 NOTE — ED Triage Notes (Signed)
Pt in with R knee pain after dancing today. To triage in wheelchair, alert, interactive, in NAD.

## 2016-02-21 NOTE — ED Provider Notes (Signed)
[facility] Note   CSN: [id number] Arrival date & time: 02/21/16  2010  By signing my name below, I, Arianna Nassar, attest that this documentation has been prepared under the direction and in the presence of Nira Conn, MD.  Electronically Signed: Octavia Heir, ED Scribe. 02/21/16. 9:48 PM.    History   Chief Complaint Chief Complaint  Patient presents with  . Knee Pain    The history is provided by the patient. No language interpreter was used.   HPI Comments: Rhonda Mckee is a 60 y.o. female who has a PMhx of COPD, DM, hepatitis C presents to the Emergency Department complaining of sudden onset, gradual worsening, moderate, posterior, right knee pain onset this evening. The pain radiates up into her right side. Pt reports she felt sudden right knee pain after dancing this evening. She describes her pain as "excrutiating". Pt says her knee pain is worse with extension and she is unable to apply pressure when ambulating. She notes taking flexeril at home to alleviate her pain without any relief. There are no other complaints or symptoms expressed.  Past Medical History:  Diagnosis Date  . COPD (chronic obstructive pulmonary disease) (HCC)   . Diabetes mellitus without complication (HCC)   . Hepatitis C    took treatment  . Herpes     There are no active problems to display for this patient.   Past Surgical History:  Procedure Laterality Date  . ABDOMINAL HYSTERECTOMY      OB History    No data available       Home Medications    Prior to Admission medications   Medication Sig Start Date End Date Taking? Authorizing Provider  acyclovir (ZOVIRAX) 400 MG tablet Take 400 mg by mouth daily.    Historical Provider, MD  albuterol (PROVENTIL HFA;VENTOLIN HFA) 108 (90 BASE) MCG/ACT inhaler Inhale 1-2 puffs into the lungs every 6 (six) hours as needed for wheezing or shortness of breath. 02/19/14   Mirian Mo, MD  atorvastatin  (LIPITOR) 10 MG tablet Take 10 mg by mouth daily.    Historical Provider, MD  azithromycin (ZITHROMAX Z-PAK) 250 MG tablet Take 2 tabs PO day 1 followed by 1 tab PO daily x 4 days 02/19/14   Mirian Mo, MD  busPIRone (BUSPAR) 10 MG tablet Take 20 mg by mouth 3 (three) times daily.    Historical Provider, MD  diclofenac (VOLTAREN) 50 MG EC tablet Take 50 mg by mouth 3 (three) times daily.    Historical Provider, MD  fesoterodine (TOVIAZ) 8 MG TB24 tablet Take 8 mg by mouth daily.    Historical Provider, MD  ibuprofen (ADVIL,MOTRIN) 800 MG tablet Take 800 mg by mouth every 8 (eight) hours as needed.    Historical Provider, MD  lisinopril (PRINIVIL,ZESTRIL) 20 MG tablet Take 20 mg by mouth daily.    Historical Provider, MD  metFORMIN (GLUCOPHAGE) 500 MG tablet Take 500 mg by mouth 2 (two) times daily with a meal.    Historical Provider, MD  oxyCODONE-acetaminophen (PERCOCET/ROXICET) 5-325 MG per tablet 1 to 2 tabs PO q6hrs  PRN for pain 06/09/14   Joni Reining Pisciotta, PA-C  predniSONE (DELTASONE) 50 MG tablet Take 1 tablet (50 mg total) by mouth daily. 02/19/14   Mirian Mo, MD    Family History History reviewed. No pertinent family history.  Social History Social History  Substance Use Topics  . Smoking status: Current Every Day Smoker    Types: Cigarettes  . Smokeless tobacco:  Never Used  . Alcohol use No     Allergies   Review of patient's allergies indicates no known allergies.   Review of Systems Review of Systems  Musculoskeletal: Positive for arthralgias.  All other systems reviewed and are negative.    Physical Exam Updated Vital Signs BP 152/94   Pulse 92   Temp 98.9 F (37.2 C)   Resp 16   Ht 5\' 2"  (1.575 m)   Wt 175 lb (79.4 kg)   SpO2 100%   BMI 32.01 kg/m   Physical Exam  Constitutional: She is oriented to person, place, and time. She appears well-developed and well-nourished. No distress.  HENT:  Head: Normocephalic and atraumatic.  Right Ear:  External ear normal.  Left Ear: External ear normal.  Nose: Nose normal.  Eyes: Conjunctivae and EOM are normal. No scleral icterus.  Neck: Normal range of motion and phonation normal.  Cardiovascular: Normal rate and regular rhythm.   Pulmonary/Chest: Effort normal. No stridor. No respiratory distress.  Abdominal: She exhibits no distension.  Musculoskeletal: Normal range of motion. She exhibits tenderness. She exhibits no edema.       Right knee: She exhibits normal range of motion, no swelling, no effusion, normal patellar mobility, no bony tenderness, normal meniscus and no MCL laxity. No medial joint line, no lateral joint line, no MCL, no LCL and no patellar tendon tenderness noted.  Tenderness to the medial aspect of the right thigh at the hamstring muscles  Neurological: She is alert and oriented to person, place, and time.  Skin: She is not diaphoretic.  Psychiatric: She has a normal mood and affect. Her behavior is normal.  Vitals reviewed.    ED Treatments / Results  DIAGNOSTIC STUDIES: Oxygen Saturation is 100% on RA, normal by my interpretation.  COORDINATION OF CARE:  9:46 PM Discussed treatment plan with pt at bedside and pt agreed to plan.  Labs (all labs ordered are listed, but only abnormal results are displayed) Labs Reviewed - No data to display  EKG  EKG Interpretation None       Radiology Dg Knee Complete 4 Views Right  Result Date: 02/21/2016 CLINICAL DATA:  Right knee injury. EXAM: RIGHT KNEE - COMPLETE 4+ VIEW COMPARISON:  None. FINDINGS: No fracture, joint effusion, malalignment or suspicious focal osseous lesion. No appreciable degenerative or erosive arthropathy. Small superior right patellar spur. No radiopaque foreign body. IMPRESSION: No fracture, joint effusion or malalignment. Electronically Signed   By: Delbert Phenix M.D.   On: 02/21/2016 21:02    Procedures Procedures (including critical care time)  Medications Ordered in ED Medications    ibuprofen (ADVIL,MOTRIN) tablet 600 mg (600 mg Oral Not Given 02/21/16 2219)     Initial Impression / Assessment and Plan / ED Course  I have reviewed the triage vital signs and the nursing notes.  Pertinent labs & imaging results that were available during my care of the patient were reviewed by me and considered in my medical decision making (see chart for details).  Clinical Course    Presentation consistent with muscle spasm of the right hamstring muscles. Given mechanism low suspicion for tendon, ligament or meniscal involvement.  Instructed to treat symptomatically with over-the-counter pain medicines, heat, massage this. Patient already has muscle relaxers at home.  A for discharge with strict return precautions.  I personally performed the services described in this documentation, which was scribed in my presence. The recorded information has been reviewed and is accurate.    Final Clinical  Impressions(s) / ED Diagnoses   Final diagnoses:  Hamstring muscle strain, right, initial encounter   Disposition: Discharge  Condition: Good  I have discussed the results, Dx and Tx plan with the patient who expressed understanding and agree(s) with the plan. Discharge instructions discussed at great length. The patient was given strict return precautions who verbalized understanding of the instructions. No further questions at time of discharge.    Discharge Medication List as of 02/21/2016  9:51 PM      Follow Up: Lynn ItoLori Boss, MD [address] [place] [id number] [phone]  Schedule an appointment as soon as possible for a visit  If symptoms do not improve or  worsen in 5-7 days      Nira ConnPedro Eduardo Cardama, MD 02/22/16 0030

## 2016-08-17 ENCOUNTER — Emergency Department (HOSPITAL_BASED_OUTPATIENT_CLINIC_OR_DEPARTMENT_OTHER): Payer: Medicare Other

## 2016-08-17 ENCOUNTER — Emergency Department (HOSPITAL_BASED_OUTPATIENT_CLINIC_OR_DEPARTMENT_OTHER)
Admission: EM | Admit: 2016-08-17 | Discharge: 2016-08-17 | Disposition: A | Discharge status: Home / Self Care | Payer: Medicare Other | Attending: Emergency Medicine | Admitting: Emergency Medicine

## 2016-08-17 ENCOUNTER — Encounter (HOSPITAL_BASED_OUTPATIENT_CLINIC_OR_DEPARTMENT_OTHER): Payer: Self-pay | Admitting: Emergency Medicine

## 2016-08-17 DIAGNOSIS — E119 Type 2 diabetes mellitus without complications: Secondary | ICD-10-CM | POA: Insufficient documentation

## 2016-08-17 DIAGNOSIS — J441 Chronic obstructive pulmonary disease with (acute) exacerbation: Secondary | ICD-10-CM | POA: Diagnosis not present

## 2016-08-17 DIAGNOSIS — R062 Wheezing: Secondary | ICD-10-CM | POA: Diagnosis present

## 2016-08-17 DIAGNOSIS — Z79899 Other long term (current) drug therapy: Secondary | ICD-10-CM | POA: Diagnosis not present

## 2016-08-17 DIAGNOSIS — F17210 Nicotine dependence, cigarettes, uncomplicated: Secondary | ICD-10-CM | POA: Diagnosis not present

## 2016-08-17 HISTORY — DX: Unspecified osteoarthritis, unspecified site: M19.90

## 2016-08-17 HISTORY — DX: Fibromyalgia: M79.7

## 2016-08-17 LAB — CBC WITH DIFFERENTIAL/PLATELET
BASOS ABS: 0 10*3/uL (ref 0.0–0.1)
Basophils Relative: 1 %
Eosinophils Absolute: 0.7 10*3/uL (ref 0.0–0.7)
Eosinophils Relative: 11 %
HEMATOCRIT: 39.5 % (ref 36.0–46.0)
Hemoglobin: 12.8 g/dL (ref 12.0–15.0)
Lymphocytes Relative: 29 %
Lymphs Abs: 1.8 10*3/uL (ref 0.7–4.0)
MCH: 27.2 pg (ref 26.0–34.0)
MCHC: 32.4 g/dL (ref 30.0–36.0)
MCV: 84 fL (ref 78.0–100.0)
MONO ABS: 0.6 10*3/uL (ref 0.1–1.0)
Monocytes Relative: 10 %
NEUTROS ABS: 3.1 10*3/uL (ref 1.7–7.7)
Neutrophils Relative %: 49 %
Platelets: 221 10*3/uL (ref 150–400)
RBC: 4.7 MIL/uL (ref 3.87–5.11)
RDW: 14.9 % (ref 11.5–15.5)
WBC: 6.3 10*3/uL (ref 4.0–10.5)

## 2016-08-17 LAB — BASIC METABOLIC PANEL
Anion gap: 7 (ref 5–15)
BUN: 11 mg/dL (ref 6–20)
CALCIUM: 9.8 mg/dL (ref 8.9–10.3)
CO2: 24 mmol/L (ref 22–32)
Chloride: 108 mmol/L (ref 101–111)
Creatinine, Ser: 0.73 mg/dL (ref 0.44–1.00)
GFR calc Af Amer: >60 mL/min (ref >60)
GFR calc non Af Amer: >60 mL/min (ref >60)
Glucose, Bld: 200 mg/dL — ABNORMAL HIGH (ref 65–99)
Potassium: 3.7 mmol/L (ref 3.5–5.1)
Sodium: 139 mmol/L (ref 135–145)

## 2016-08-17 MED ORDER — ALBUTEROL SULFATE (2.5 MG/3ML) 0.083% IN NEBU
2.5000 mg | INHALATION_SOLUTION | Freq: Once | RESPIRATORY_TRACT | Status: AC
  Administered 2016-08-17: 2.5 mg via RESPIRATORY_TRACT
  Filled 2016-08-17: qty 3

## 2016-08-17 MED ORDER — AZITHROMYCIN 250 MG PO TABS: 250.0000 mg | ORAL_TABLET | Freq: Every day | ORAL | 0 refills | Status: DC

## 2016-08-17 MED ORDER — IPRATROPIUM BROMIDE 0.02 % IN SOLN
RESPIRATORY_TRACT | Status: AC
  Administered 2016-08-17: 0.5 mg
  Filled 2016-08-17: qty 2.5

## 2016-08-17 MED ORDER — LEVALBUTEROL HCL 1.25 MG/0.5ML IN NEBU
1.2500 mg | INHALATION_SOLUTION | Freq: Once | RESPIRATORY_TRACT | Status: AC
  Administered 2016-08-17: 1.25 mg via RESPIRATORY_TRACT
  Filled 2016-08-17: qty 0.5

## 2016-08-17 MED ORDER — IPRATROPIUM-ALBUTEROL 0.5-2.5 (3) MG/3ML IN SOLN
3.0000 mL | Freq: Once | RESPIRATORY_TRACT | Status: AC
  Administered 2016-08-17: 3 mL via RESPIRATORY_TRACT
  Filled 2016-08-17: qty 3

## 2016-08-17 MED ORDER — PREDNISONE 10 MG PO TABS: 50.0000 mg | ORAL_TABLET | Freq: Every day | ORAL | 0 refills | Status: DC

## 2016-08-17 MED ORDER — ALBUTEROL (5 MG/ML) CONTINUOUS INHALATION SOLN
INHALATION_SOLUTION | RESPIRATORY_TRACT | Status: AC
  Administered 2016-08-17: 10 mg/h
  Filled 2016-08-17: qty 20

## 2016-08-17 MED ORDER — PREDNISONE 50 MG PO TABS
60.0000 mg | ORAL_TABLET | Freq: Once | ORAL | Status: AC
  Administered 2016-08-17: 60 mg via ORAL
  Filled 2016-08-17: qty 1

## 2016-08-17 MED ORDER — ALBUTEROL (5 MG/ML) CONTINUOUS INHALATION SOLN: 10.0000 mg/h | INHALATION_SOLUTION | Freq: Once | RESPIRATORY_TRACT | Status: DC

## 2016-08-17 NOTE — ED Provider Notes (Addendum)
[facility] Provider Note   CSN: [id number] Arrival date & time: 08/17/16  1514     History   Chief Complaint Chief Complaint  Patient presents with  . Wheezing    HPI Rhonda Mckee is a 61 y.o. female.  The history is provided by the patient.  Wheezing   This is a new problem. The current episode started more than 2 days ago. The problem occurs constantly. The problem has not changed since onset.Associated symptoms include coryza, rhinorrhea, cough and sputum production. Pertinent negatives include no chest pain, no fever, no abdominal pain, no vomiting, no diarrhea and no ear pain. Precipitated by: URI symptoms. She has tried beta-agonist inhalers for the symptoms. The treatment provided mild relief. She has had no prior hospitalizations. She has had no prior ED visits. She has had no prior ICU admissions. Her past medical history is significant for COPD. Her past medical history does not include heart failure, CAD, past MI, PE or DVT.    Past Medical History:  Diagnosis Date  . Arthritis   . COPD (chronic obstructive pulmonary disease) (HCC)   . Diabetes mellitus without complication (HCC)   . Fibromyalgia   . Hepatitis C    took treatment  . Herpes     There are no active problems to display for this patient.   Past Surgical History:  Procedure Laterality Date  . ABDOMINAL HYSTERECTOMY      OB History    No data available       Home Medications    Prior to Admission medications   Medication Sig Start Date End Date Taking? Authorizing Provider  oxybutynin (DITROPAN-XL) 5 MG 24 hr tablet Take 5 mg by mouth at bedtime.   Yes Historical Provider, MD  acyclovir (ZOVIRAX) 400 MG tablet Take 400 mg by mouth daily.    Historical Provider, MD  albuterol (PROVENTIL HFA;VENTOLIN HFA) 108 (90 BASE) MCG/ACT inhaler Inhale 1-2 puffs into the lungs every 6 (six) hours as needed for wheezing or shortness of breath. 02/19/14   Mirian Mo, MD  atorvastatin  (LIPITOR) 10 MG tablet Take 10 mg by mouth daily.    Historical Provider, MD  azithromycin (ZITHROMAX Z-PAK) 250 MG tablet Take 2 tabs PO day 1 followed by 1 tab PO daily x 4 days 02/19/14   Mirian Mo, MD  azithromycin (ZITHROMAX) 250 MG tablet Take 1 tablet (250 mg total) by mouth daily. Take first 2 tablets together, then 1 every day until finished. 08/17/16   Lavera Guise, MD  busPIRone (BUSPAR) 10 MG tablet Take 20 mg by mouth 3 (three) times daily.    Historical Provider, MD  diclofenac (VOLTAREN) 50 MG EC tablet Take 50 mg by mouth 3 (three) times daily.    Historical Provider, MD  fesoterodine (TOVIAZ) 8 MG TB24 tablet Take 8 mg by mouth daily.    Historical Provider, MD  ibuprofen (ADVIL,MOTRIN) 800 MG tablet Take 800 mg by mouth every 8 (eight) hours as needed.    Historical Provider, MD  lisinopril (PRINIVIL,ZESTRIL) 20 MG tablet Take 20 mg by mouth daily.    Historical Provider, MD  metFORMIN (GLUCOPHAGE) 500 MG tablet Take 500 mg by mouth 2 (two) times daily with a meal.    Historical Provider, MD  oxyCODONE-acetaminophen (PERCOCET/ROXICET) 5-325 MG per tablet 1 to 2 tabs PO q6hrs  PRN for pain 06/09/14   Joni Reining Pisciotta, PA-C  predniSONE (DELTASONE) 10 MG tablet Take 5 tablets (50 mg total) by mouth daily. 08/18/16  Lavera Guiseana Duo Hairo Garraway, MD  predniSONE (DELTASONE) 50 MG tablet Take 1 tablet (50 mg total) by mouth daily. 02/19/14   Mirian MoMatthew Gentry, MD    Family History No family history on file.  Social History Social History  Substance Use Topics  . Smoking status: Current Every Day Smoker    Packs/day: 0.50    Types: Cigarettes  . Smokeless tobacco: Never Used  . Alcohol use No     Allergies   Patient has no known allergies.   Review of Systems Review of Systems  Constitutional: Negative for fever.  HENT: Positive for rhinorrhea. Negative for ear pain.   Eyes: Negative for discharge.  Respiratory: Positive for cough, sputum production and wheezing.   Cardiovascular:  Negative for chest pain.  Gastrointestinal: Negative for abdominal pain, diarrhea and vomiting.  Genitourinary: Negative for difficulty urinating.  Allergic/Immunologic: Negative for immunocompromised state.  Neurological: Negative for syncope.  Hematological: Does not bruise/bleed easily.  Psychiatric/Behavioral: Negative for confusion.     Physical Exam Updated Vital Signs BP 138/74   Pulse (!) 109   Temp 98.9 F (37.2 C) (Oral)   Resp (!) 25   Ht 5\' 2"  (1.575 m)   Wt 182 lb (82.6 kg)   SpO2 95%   BMI 33.29 kg/m   Physical Exam Physical Exam  Nursing note and vitals reviewed. Constitutional: Well developed, well nourished, non-toxic, and in no acute distress Head: Normocephalic and atraumatic.  Mouth/Throat: Oropharynx is clear and moist.  Neck: Normal range of motion. Neck supple.  Cardiovascular: Normal rate and regular rhythm.  no edema Pulmonary/Chest: Effort normal and breath sounds normal.  Abdominal: Soft. There is no tenderness. There is no rebound and no guarding.  Musculoskeletal: Normal range of motion. No calf tenderness Neurological: Alert, no facial droop, fluent speech, moves all extremities symmetrically Skin: Skin is warm and dry.  Psychiatric: Cooperative   ED Treatments / Results  Labs (all labs ordered are listed, but only abnormal results are displayed) Labs Reviewed  BASIC METABOLIC PANEL - Abnormal; Notable for the following:       Result Value   Glucose, Bld 200 (*)    All other components within normal limits  CBC WITH DIFFERENTIAL/PLATELET    EKG  EKG Interpretation  Date/Time:  Tuesday August 17 2016 16:03:50 EDT Ventricular Rate:  107 PR Interval:    QRS Duration: 82 QT Interval:  363 QTC Calculation: 485 R Axis:   63 Text Interpretation:  Sinus tachycardia Probable left atrial enlargement no prior EKg  Confirmed by Necie Wilcoxson MD, Okley Magnussen [phone]([id number]) on 08/17/2016 5:57:56 PM       Radiology Dg Chest 2 View  Result Date:  08/17/2016 CLINICAL DATA:  Coughing and sneezing x2 days. Wheezing started this morning. Hx of COPD EXAM: CHEST  2 VIEW COMPARISON:  06/09/2014; 02/19/2014; chest CT- 08/10/2011 FINDINGS: Grossly unchanged cardiac silhouette and mediastinal contours. The lungs remain hyperexpanded with flattening the diaphragms and mild diffuse slightly nodular thickening of the pulmonary interstitium. No focal airspace opacities. No pleural effusion or pneumothorax. No evidence of edema. Stigmata of DISH within the thoracic spine. IMPRESSION: Lung hyperexpansion and bronchitic change without acute cardiopulmonary disease. Electronically Signed   By: Simonne ComeJohn  Watts M.D.   On: 08/17/2016 15:56    Procedures Procedures (including critical care time) .ddl Medications Ordered in ED Medications  ipratropium-albuterol (DUONEB) 0.5-2.5 (3) MG/3ML nebulizer solution 3 mL (3 mLs Nebulization Given 08/17/16 1530)  albuterol (PROVENTIL) (2.5 MG/3ML) 0.083% nebulizer solution 2.5 mg (2.5 mg Nebulization Given  08/17/16 1530)  predniSONE (DELTASONE) tablet 60 mg (60 mg Oral Given 08/17/16 1613)  levalbuterol (XOPENEX) nebulizer solution 1.25 mg (1.25 mg Nebulization Given 08/17/16 1655)  ipratropium (ATROVENT) 0.02 % nebulizer solution (0.5 mg  Given 08/17/16 1655)  albuterol (PROVENTIL, VENTOLIN) (5 MG/ML) 0.5% continuous inhalation solution (10 mg/hr  Given 08/17/16 1738)     Initial Impression / Assessment and Plan / ED Course  I have reviewed the triage vital signs and the nursing notes.  Pertinent labs & imaging results that were available during my care of the patient were reviewed by me and considered in my medical decision making (see chart for details).     History of COPD, with cough, wheezing, runny nose, congestion x 3 days. Also current tobacco use.   Is not hypoxic but is tachypneic on my evaluation with diffuse expiratory wheezing. Presentation consistent with that of COPD exacerbation likely the setting of a viral  upper respiratory illness. Chest x-ray is visualized does not show infiltrate, edema or other acute cardiopulmonary processes.  Given duoneb x 2 and placed on continuous albuterol for continued wheezing. Does feel significant improvement afterwards. Is able to ambulate in the ED without hypoxia or reported shortness of breath. Does have some residual wheezing on exam and still a little tachycardic, but may be more effect of albuterol. Treated with steroids and zpack  Offered observation admission for additional breathing treatments, but she requests to continue management at home and would come back for worsening symptoms Strict return and follow-up instructions reviewed. She expressed understanding of all discharge instructions and felt comfortable with the plan of care.     Final Clinical Impressions(s) / ED Diagnoses   Final diagnoses:  Acute exacerbation of chronic obstructive pulmonary disease (COPD) (HCC)    New Prescriptions New Prescriptions   AZITHROMYCIN (ZITHROMAX) 250 MG TABLET    Take 1 tablet (250 mg total) by mouth daily. Take first 2 tablets together, then 1 every day until finished.   PREDNISONE (DELTASONE) 10 MG TABLET    Take 5 tablets (50 mg total) by mouth daily.     Lavera Guise, MD 08/17/16 1912    Lavera Guise, MD 08/17/16 [phone]

## 2016-08-17 NOTE — ED Triage Notes (Signed)
Coughing and sneezing x2 days. Wheezing started this morning. Hx of COPD.  Audible wheezing

## 2016-08-17 NOTE — Discharge Instructions (Signed)
Take steroids and antibiotics as prescribed.   Use your albuterol inhaler 6-8 puffs every 3-4 hours scheduled for the next day. Then just take 2 puffs as needed for shortness of breath.  Return without fail for worsening symptoms, including fever, difficulty breathing, passing out, severe chest pain or any other symptoms concerning ot you.

## 2016-08-17 NOTE — ED Notes (Signed)
Patient ambulated to restroom and in hallway on room air, HR 120, RR 24-28, SpO2 93-98%. BBS with exp wheezes.

## 2018-01-11 ENCOUNTER — Encounter (HOSPITAL_BASED_OUTPATIENT_CLINIC_OR_DEPARTMENT_OTHER): Payer: Self-pay

## 2018-01-11 ENCOUNTER — Other Ambulatory Visit: Payer: Self-pay

## 2018-01-11 ENCOUNTER — Emergency Department (HOSPITAL_BASED_OUTPATIENT_CLINIC_OR_DEPARTMENT_OTHER)
Admission: EM | Admit: 2018-01-11 | Discharge: 2018-01-11 | Disposition: A | Discharge status: Home / Self Care | Payer: Medicare Other | Attending: Emergency Medicine | Admitting: Emergency Medicine

## 2018-01-11 DIAGNOSIS — T24232A Burn of second degree of left lower leg, initial encounter: Secondary | ICD-10-CM | POA: Diagnosis not present

## 2018-01-11 DIAGNOSIS — Y92010 Kitchen of single-family (private) house as the place of occurrence of the external cause: Secondary | ICD-10-CM | POA: Diagnosis not present

## 2018-01-11 DIAGNOSIS — Z79899 Other long term (current) drug therapy: Secondary | ICD-10-CM | POA: Insufficient documentation

## 2018-01-11 DIAGNOSIS — E119 Type 2 diabetes mellitus without complications: Secondary | ICD-10-CM | POA: Diagnosis not present

## 2018-01-11 DIAGNOSIS — T24202A Burn of second degree of unspecified site of left lower limb, except ankle and foot, initial encounter: Secondary | ICD-10-CM

## 2018-01-11 DIAGNOSIS — Z7984 Long term (current) use of oral hypoglycemic drugs: Secondary | ICD-10-CM | POA: Diagnosis not present

## 2018-01-11 DIAGNOSIS — Y93G3 Activity, cooking and baking: Secondary | ICD-10-CM | POA: Diagnosis not present

## 2018-01-11 DIAGNOSIS — F17210 Nicotine dependence, cigarettes, uncomplicated: Secondary | ICD-10-CM | POA: Diagnosis not present

## 2018-01-11 DIAGNOSIS — J449 Chronic obstructive pulmonary disease, unspecified: Secondary | ICD-10-CM | POA: Diagnosis not present

## 2018-01-11 DIAGNOSIS — X102XXA Contact with fats and cooking oils, initial encounter: Secondary | ICD-10-CM | POA: Diagnosis not present

## 2018-01-11 DIAGNOSIS — Y999 Unspecified external cause status: Secondary | ICD-10-CM | POA: Diagnosis not present

## 2018-01-11 MED ORDER — SILVER SULFADIAZINE 1 % EX CREA
TOPICAL_CREAM | Freq: Once | CUTANEOUS | Status: AC
  Administered 2018-01-11: 1 via TOPICAL
  Filled 2018-01-11: qty 85

## 2018-01-11 MED ORDER — SILVER SULFADIAZINE 1 % EX CREA: 1.0000 "application " | TOPICAL_CREAM | Freq: Every day | CUTANEOUS | 0 refills | Status: AC

## 2018-01-11 MED ORDER — OXYCODONE-ACETAMINOPHEN 5-325 MG PO TABS: 1.0000 | ORAL_TABLET | Freq: Four times a day (QID) | ORAL | 0 refills | Status: DC | PRN

## 2018-01-11 MED ORDER — KETOROLAC TROMETHAMINE 30 MG/ML IJ SOLN
30.0000 mg | Freq: Once | INTRAMUSCULAR | Status: AC
  Administered 2018-01-11: 30 mg via INTRAVENOUS
  Filled 2018-01-11: qty 1

## 2018-01-11 MED ORDER — OXYCODONE-ACETAMINOPHEN 5-325 MG PO TABS
1.0000 | ORAL_TABLET | Freq: Once | ORAL | Status: AC
  Administered 2018-01-11: 1 via ORAL
  Filled 2018-01-11: qty 1
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
## 2018-05-06 IMAGING — CR DG CHEST 2V
2 series · 2 of 2 positions shown · non-contrast
Comparison: 06/09/2014; 02/19/2014; chest CT- 08/10/2011

CLINICAL DATA: Coughing and sneezing x2 days. Wheezing started this
morning. Hx of COPD

EXAM:
CHEST  2 VIEW

[w chest pa]
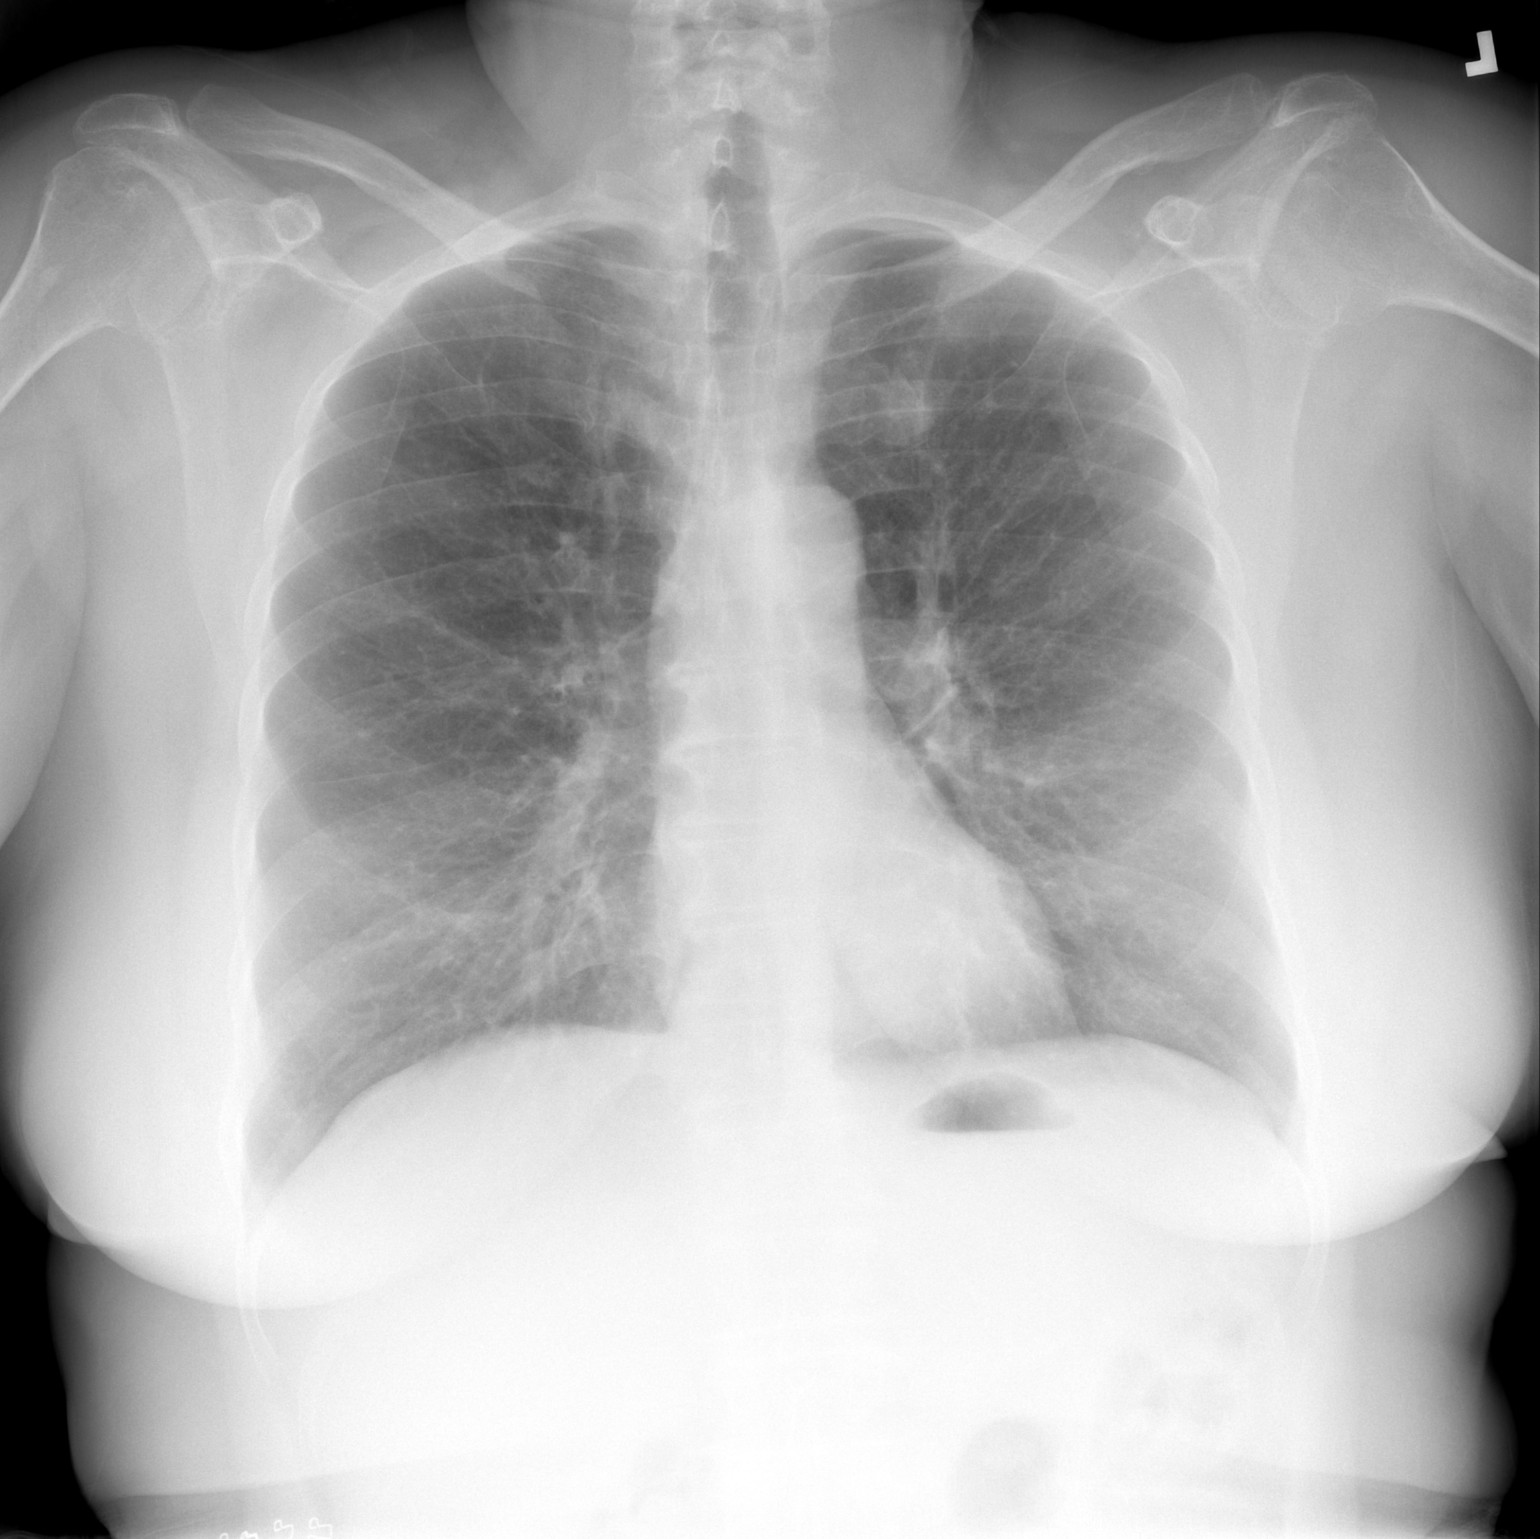

[w chest lat]
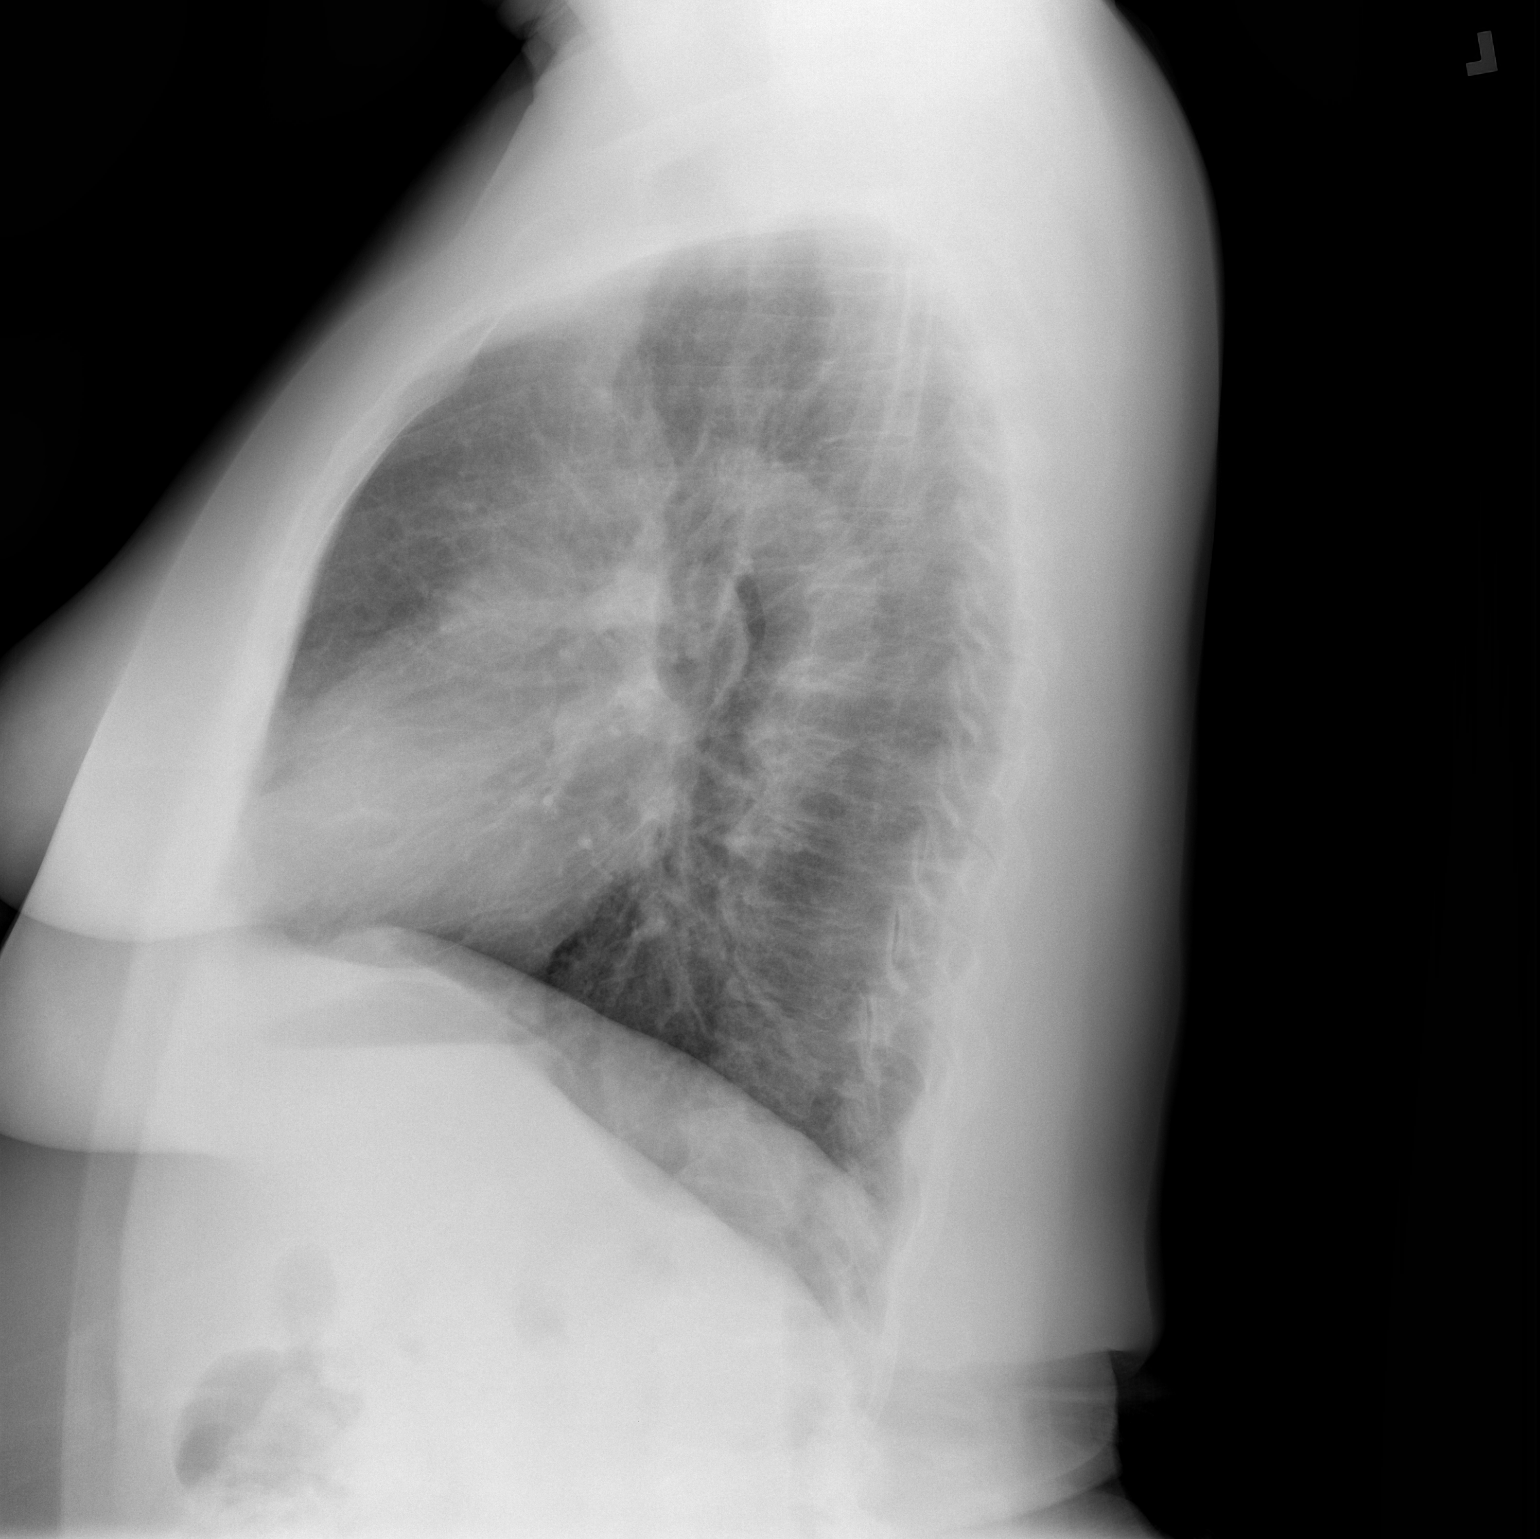

[2 of 2 positions shown; findings below may reference images not displayed]

FINDINGS: Grossly unchanged cardiac silhouette and mediastinal contours. The
lungs remain hyperexpanded with flattening the diaphragms and mild
diffuse slightly nodular thickening of the pulmonary interstitium.
No focal airspace opacities. No pleural effusion or pneumothorax. No
evidence of edema. Stigmata of DISH within the thoracic spine.
IMPRESSION: Lung hyperexpansion and bronchitic change without acute
cardiopulmonary disease.

## 2020-06-09 ENCOUNTER — Emergency Department (HOSPITAL_BASED_OUTPATIENT_CLINIC_OR_DEPARTMENT_OTHER): Payer: Medicare HMO

## 2020-06-09 ENCOUNTER — Encounter (HOSPITAL_BASED_OUTPATIENT_CLINIC_OR_DEPARTMENT_OTHER): Payer: Self-pay

## 2020-06-09 ENCOUNTER — Other Ambulatory Visit: Payer: Self-pay

## 2020-06-09 ENCOUNTER — Emergency Department (HOSPITAL_BASED_OUTPATIENT_CLINIC_OR_DEPARTMENT_OTHER)
Admission: EM | Admit: 2020-06-09 | Discharge: 2020-06-09 | Disposition: A | Discharge status: Home / Self Care | Payer: Medicare HMO | Attending: Emergency Medicine | Admitting: Emergency Medicine

## 2020-06-09 DIAGNOSIS — R079 Chest pain, unspecified: Secondary | ICD-10-CM | POA: Diagnosis present

## 2020-06-09 DIAGNOSIS — Z7984 Long term (current) use of oral hypoglycemic drugs: Secondary | ICD-10-CM | POA: Diagnosis not present

## 2020-06-09 DIAGNOSIS — E119 Type 2 diabetes mellitus without complications: Secondary | ICD-10-CM | POA: Insufficient documentation

## 2020-06-09 DIAGNOSIS — R202 Paresthesia of skin: Secondary | ICD-10-CM | POA: Diagnosis not present

## 2020-06-09 DIAGNOSIS — J449 Chronic obstructive pulmonary disease, unspecified: Secondary | ICD-10-CM | POA: Insufficient documentation

## 2020-06-09 DIAGNOSIS — F17210 Nicotine dependence, cigarettes, uncomplicated: Secondary | ICD-10-CM | POA: Insufficient documentation

## 2020-06-09 LAB — BASIC METABOLIC PANEL
Anion gap: 10 (ref 5–15)
BUN: 18 mg/dL (ref 8–23)
CO2: 24 mmol/L (ref 22–32)
Calcium: 10.3 mg/dL (ref 8.9–10.3)
Chloride: 105 mmol/L (ref 98–111)
Creatinine, Ser: 0.91 mg/dL (ref 0.44–1.00)
GFR, Estimated: >60 mL/min (ref >60)
Glucose, Bld: 121 mg/dL — ABNORMAL HIGH (ref 70–99)
Potassium: 4.1 mmol/L (ref 3.5–5.1)
Sodium: 139 mmol/L (ref 135–145)

## 2020-06-09 LAB — CBC
HCT: 39.9 % (ref 36.0–46.0)
Hemoglobin: 12.3 g/dL (ref 12.0–15.0)
MCH: 26.7 pg (ref 26.0–34.0)
MCHC: 30.8 g/dL (ref 30.0–36.0)
MCV: 86.7 fL (ref 80.0–100.0)
Platelets: 235 10*3/uL (ref 150–400)
RBC: 4.6 MIL/uL (ref 3.87–5.11)
RDW: 16.7 % — ABNORMAL HIGH (ref 11.5–15.5)
WBC: 7.7 10*3/uL (ref 4.0–10.5)
nRBC: 0 % (ref 0.0–0.2)

## 2020-06-09 LAB — TROPONIN I (HIGH SENSITIVITY)
Troponin I (High Sensitivity): 2 ng/L (ref <18)
Troponin I (High Sensitivity): <2 ng/L (ref <18)

## 2020-06-09 NOTE — ED Provider Notes (Signed)
[facility] EMERGENCY DEPARTMENT Provider Note   CSN: [id number] Arrival date & time: 06/09/20  1453     History Chief Complaint  Patient presents with  . Chest Pain    Rhonda Mckee is a 65 y.o. female.  Patient presents to ER chief complaint of chest pain.  Describes it as an achy sensation the left chest.  Associate with tingling along the left arm.  Symptoms have been waxing and waning in intensity over the course the last 3 days.  They were not resolving so she presents to ER today.  Denies fevers or cough.  No vomiting or diarrhea.  States currently the pain has been waxing away and is nearly gone.        Past Medical History:  Diagnosis Date  . Arthritis   . COPD (chronic obstructive pulmonary disease) (HCC)   . Diabetes mellitus without complication (HCC)   . Fibromyalgia   . Hepatitis C    took treatment  . Herpes     There are no problems to display for this patient.   Past Surgical History:  Procedure Laterality Date  . ABDOMINAL HYSTERECTOMY       OB History   No obstetric history on file.     History reviewed. No pertinent family history.  Social History   Tobacco Use  . Smoking status: Current Every Day Smoker    Packs/day: 0.50    Types: Cigarettes  . Smokeless tobacco: Never Used  Substance Use Topics  . Alcohol use: No  . Drug use: No    Home Medications Prior to Admission medications   Medication Sig Start Date End Date Taking? Authorizing Provider  acyclovir (ZOVIRAX) 400 MG tablet Take 400 mg by mouth daily.    [provider]  albuterol (PROVENTIL HFA;VENTOLIN HFA) 108 (90 BASE) MCG/ACT inhaler Inhale 1-2 puffs into the lungs every 6 (six) hours as needed for wheezing or shortness of breath. 02/19/14   Mirian Mo, MD  atorvastatin (LIPITOR) 10 MG tablet Take 10 mg by mouth daily.    [provider]  azithromycin (ZITHROMAX Z-PAK) 250 MG tablet Take 2 tabs PO day 1 followed by 1 tab PO daily x 4  days 02/19/14   Mirian Mo, MD  azithromycin (ZITHROMAX) 250 MG tablet Take 1 tablet (250 mg total) by mouth daily. Take first 2 tablets together, then 1 every day until finished. 08/17/16   Lavera Guise, MD  busPIRone (BUSPAR) 10 MG tablet Take 20 mg by mouth 3 (three) times daily.    [provider]  diclofenac (VOLTAREN) 50 MG EC tablet Take 50 mg by mouth 3 (three) times daily.    [provider]  fesoterodine (TOVIAZ) 8 MG TB24 tablet Take 8 mg by mouth daily.    [provider]  ibuprofen (ADVIL,MOTRIN) 800 MG tablet Take 800 mg by mouth every 8 (eight) hours as needed.    [provider]  lisinopril (PRINIVIL,ZESTRIL) 20 MG tablet Take 20 mg by mouth daily.    [provider]  metFORMIN (GLUCOPHAGE) 500 MG tablet Take 500 mg by mouth 2 (two) times daily with a meal.    [provider]  oxybutynin (DITROPAN-XL) 5 MG 24 hr tablet Take 5 mg by mouth at bedtime.    [provider]  oxyCODONE-acetaminophen (PERCOCET/ROXICET) 5-325 MG tablet Take 1-2 tablets by mouth every 6 (six) hours as needed for severe pain. 01/11/18   Terrilee Files, MD  predniSONE (DELTASONE) 10 MG tablet  Take 5 tablets (50 mg total) by mouth daily. 08/18/16   Lavera Guise, MD  predniSONE (DELTASONE) 50 MG tablet Take 1 tablet (50 mg total) by mouth daily. 02/19/14   Mirian Mo, MD  silver sulfADIAZINE (SILVADENE) 1 % cream Apply 1 application topically daily. 01/11/18   Terrilee Files, MD    Allergies    Patient has no known allergies.  Review of Systems   Review of Systems  Constitutional: Negative for fever.  HENT: Negative for ear pain.   Eyes: Negative for pain.  Respiratory: Negative for cough.   Cardiovascular: Positive for chest pain.  Gastrointestinal: Negative for abdominal pain.  Genitourinary: Negative for flank pain.  Musculoskeletal: Negative for back pain.  Skin: Negative for rash.  Neurological: Negative for headaches.     Physical Exam Updated Vital Signs BP 114/79   Pulse 84   Temp 98.3 F (36.8 C) (Oral)   Resp 16   Ht 5\' 1"  (1.549 m)   Wt 75.8 kg   SpO2 94%   BMI 31.55 kg/m   Physical Exam Constitutional:      General: She is not in acute distress.    Appearance: Normal appearance.  HENT:     Head: Normocephalic.     Nose: Nose normal.  Eyes:     Extraocular Movements: Extraocular movements intact.  Cardiovascular:     Rate and Rhythm: Normal rate.  Pulmonary:     Effort: Pulmonary effort is normal.  Musculoskeletal:        General: Normal range of motion.     Cervical back: Normal range of motion.     Comments: Bilateral upper extremities neuro intact.  Neurological:     General: No focal deficit present.     Mental Status: She is alert. Mental status is at baseline.     ED Results / Procedures / Treatments   Labs (all labs ordered are listed, but only abnormal results are displayed) Labs Reviewed  BASIC METABOLIC PANEL - Abnormal; Notable for the following components:      Result Value   Glucose, Bld 121 (*)    All other components within normal limits  CBC - Abnormal; Notable for the following components:   RDW 16.7 (*)    All other components within normal limits  TROPONIN I (HIGH SENSITIVITY)  TROPONIN I (HIGH SENSITIVITY)    EKG EKG Interpretation  Date/Time:  Monday June 09 2020 14:55:56 EST Ventricular Rate:  104 PR Interval:  146 QRS Duration: 76 QT Interval:  346 QTC Calculation: 454 R Axis:   38 Text Interpretation: Sinus tachycardia Septal infarct , age undetermined Abnormal ECG No STEMI Confirmed by 05-20-2001 [id number]) on 06/09/2020 3:08:28 PM   Radiology DG Chest 2 View  Result Date: 06/09/2020 CLINICAL DATA:  Chest pain and shortness of breath EXAM: CHEST - 2 VIEW COMPARISON:  Multiple priors including chest CT January 18, 2020 and chest radiograph May 04, 2018. FINDINGS: The heart size and mediastinal contours are within normal limits.  Lung nodules previously described on prior chest CT are not well visualized on today's exam. Chronic parenchymal lung changes without focal consolidation or overt pulmonary edema. No pleural effusion or pneumothorax. Thoracolumbar spondylosis. IMPRESSION: No acute cardiopulmonary disease. Electronically Signed   By: May 06, 2018 MD   On: 06/09/2020 16:25    Procedures Procedures (including critical care time)  Medications Ordered in ED Medications - No data to display  ED Course  I have reviewed the triage vital signs  and the nursing notes.  Pertinent labs & imaging results that were available during my care of the patient were reviewed by me and considered in my medical decision making (see chart for details).    MDM Rules/Calculators/A&P                          Vital signs within normal limits.  Labs otherwise unremarkable.  2 sets of troponins sent several hours apart continue to be normal.  Patient advised close follow-up with cardiology within the week.  Advised immediate return for worsening pain fevers vomiting cough or any additional concerns.  Advised rest and avoidance of strenuous activity till cleared by her cardiologist.   Final Clinical Impression(s) / ED Diagnoses Final diagnoses:  Nonspecific chest pain    Rx / DC Orders ED Discharge Orders    None       Cheryll Cockayne, MD 06/09/20 2028

## 2020-06-09 NOTE — ED Triage Notes (Signed)
Pt arrives with epigastric pain & chest tightness, starting a few hours ago with nausea & shortness of breath. Left back pain under scapula as well as posterior left arm pain intermittently for the past 3 days. Denies cardiac history

## 2020-06-09 NOTE — Discharge Instructions (Addendum)
Call the cardiologist in the next 2-3 days.   Return immediately back to the ER if:  Your symptoms worsen within the next 12-24 hours. You develop new symptoms such as new fevers, persistent vomiting, new pain, shortness of breath, or new weakness or numbness, or if you have any other concerns.

## 2020-06-26 NOTE — Progress Notes (Deleted)
Referring-Rhonda Daphine Deutscher, NP Reason for referral-chest pain  HPI: 65 year old female for evaluation of chest pain at request of Stann Ore, NP.  Patient seen in the emergency room January 2022 with chest pain.  Chest x-ray negative.  Troponins normal.  Hemoglobin 12.3.  Current Outpatient Medications  Medication Sig Dispense Refill  . acyclovir (ZOVIRAX) 400 MG tablet Take 400 mg by mouth daily.    Marland Kitchen albuterol (PROVENTIL HFA;VENTOLIN HFA) 108 (90 BASE) MCG/ACT inhaler Inhale 1-2 puffs into the lungs every 6 (six) hours as needed for wheezing or shortness of breath. 1 Inhaler 0  . atorvastatin (LIPITOR) 10 MG tablet Take 10 mg by mouth daily.    Marland Kitchen azithromycin (ZITHROMAX Z-PAK) 250 MG tablet Take 2 tabs PO day 1 followed by 1 tab PO daily x 4 days 6 tablet 0  . azithromycin (ZITHROMAX) 250 MG tablet Take 1 tablet (250 mg total) by mouth daily. Take first 2 tablets together, then 1 every day until finished. 6 tablet 0  . busPIRone (BUSPAR) 10 MG tablet Take 20 mg by mouth 3 (three) times daily.    . diclofenac (VOLTAREN) 50 MG EC tablet Take 50 mg by mouth 3 (three) times daily.    . fesoterodine (TOVIAZ) 8 MG TB24 tablet Take 8 mg by mouth daily.    Marland Kitchen ibuprofen (ADVIL,MOTRIN) 800 MG tablet Take 800 mg by mouth every 8 (eight) hours as needed.    Marland Kitchen lisinopril (PRINIVIL,ZESTRIL) 20 MG tablet Take 20 mg by mouth daily.    . metFORMIN (GLUCOPHAGE) 500 MG tablet Take 500 mg by mouth 2 (two) times daily with a meal.    . oxybutynin (DITROPAN-XL) 5 MG 24 hr tablet Take 5 mg by mouth at bedtime.    Marland Kitchen oxyCODONE-acetaminophen (PERCOCET/ROXICET) 5-325 MG tablet Take 1-2 tablets by mouth every 6 (six) hours as needed for severe pain. 15 tablet 0  . predniSONE (DELTASONE) 10 MG tablet Take 5 tablets (50 mg total) by mouth daily. 20 tablet 0  . predniSONE (DELTASONE) 50 MG tablet Take 1 tablet (50 mg total) by mouth daily. 6 tablet 0  . silver sulfADIAZINE (SILVADENE) 1 % cream Apply 1 application  topically daily. 50 g 0   No current facility-administered medications for this visit.    No Known Allergies  Past Medical History:  Diagnosis Date  . Arthritis   . COPD (chronic obstructive pulmonary disease) (HCC)   . Diabetes mellitus without complication (HCC)   . Fibromyalgia   . Hepatitis C    took treatment  . Herpes     Past Surgical History:  Procedure Laterality Date  . ABDOMINAL HYSTERECTOMY      Social History   Socioeconomic History  . Marital status: Single    Spouse name: Not on file  . Number of children: Not on file  . Years of education: Not on file  . Highest education level: Not on file  Occupational History  . Not on file  Tobacco Use  . Smoking status: Current Every Day Smoker    Packs/day: 0.50    Types: Cigarettes  . Smokeless tobacco: Never Used  Substance and Sexual Activity  . Alcohol use: No  . Drug use: No  . Sexual activity: Not on file  Other Topics Concern  . Not on file  Social History Narrative  . Not on file   Social Determinants of Health   Financial Resource Strain: Not on file  Food Insecurity: Not on file  Transportation Needs: Not  on file  Physical Activity: Not on file  Stress: Not on file  Social Connections: Not on file  Intimate Partner Violence: Not on file    No family history on file.  ROS: no fevers or chills, productive cough, hemoptysis, dysphasia, odynophagia, melena, hematochezia, dysuria, hematuria, rash, seizure activity, orthopnea, PND, pedal edema, claudication. Remaining systems are negative.  Physical Exam:   There were no vitals taken for this visit.  General:  Well developed/well nourished in NAD Skin warm/dry Patient not depressed No peripheral clubbing Back-normal HEENT-normal/normal eyelids Neck supple/normal carotid upstroke bilaterally; no bruits; no JVD; no thyromegaly chest - CTA/ normal expansion CV - RRR/normal S1 and S2; no murmurs, rubs or gallops;  PMI  nondisplaced Abdomen -NT/ND, no HSM, no mass, + bowel sounds, no bruit 2+ femoral pulses, no bruits Ext-no edema, chords, 2+ DP Neuro-grossly nonfocal  ECG -June 09, 2020-sinus tachycardia, cannot rule out septal infarct, no ST changes.  Personally reviewed  A/P  1 chest pain-  Olga Millers, MD

## 2020-07-09 ENCOUNTER — Ambulatory Visit: Payer: Medicare HMO | Admitting: Cardiology

## 2020-08-20 ENCOUNTER — Ambulatory Visit: Payer: Medicare HMO | Admitting: Cardiology

## 2021-09-04 ENCOUNTER — Other Ambulatory Visit: Payer: Self-pay

## 2021-09-04 ENCOUNTER — Emergency Department (HOSPITAL_BASED_OUTPATIENT_CLINIC_OR_DEPARTMENT_OTHER): Payer: Medicare Other

## 2021-09-04 ENCOUNTER — Encounter (HOSPITAL_BASED_OUTPATIENT_CLINIC_OR_DEPARTMENT_OTHER): Payer: Self-pay | Admitting: Emergency Medicine

## 2021-09-04 ENCOUNTER — Emergency Department (HOSPITAL_BASED_OUTPATIENT_CLINIC_OR_DEPARTMENT_OTHER)
Admission: EM | Admit: 2021-09-04 | Discharge: 2021-09-04 | Disposition: A | Discharge status: Home / Self Care | Payer: Medicare Other | Attending: Emergency Medicine | Admitting: Emergency Medicine

## 2021-09-04 DIAGNOSIS — R059 Cough, unspecified: Secondary | ICD-10-CM | POA: Insufficient documentation

## 2021-09-04 DIAGNOSIS — R051 Acute cough: Secondary | ICD-10-CM

## 2021-09-04 DIAGNOSIS — Z79899 Other long term (current) drug therapy: Secondary | ICD-10-CM | POA: Insufficient documentation

## 2021-09-04 NOTE — ED Provider Notes (Signed)
?[place] EMERGENCY DEPARTMENT ?Provider Note ? ? ?CSN: [id number] ?Arrival date & time: 09/04/21  1419 ? ?  ? ?History ? ?Chief Complaint  ?Patient presents with  ? Cough  ? ? ?Rhonda Mckee is a 66 y.o. female.  Presents to ER due to concern for episode of coughing up blood.  Patient states she took some of her regular daily medication midday today.  1 included a beige appearing pill, some sort of vitamin.  Thinks that it may have gone down the wrong pipe.  Then a little while later had a bad coughing spell and at 1 point had some specks of red concerning for possible blood.  She then later expectorated one of her pills. ? ?She endorses history of emphysema is followed by pulmonology with [facility].  Patient states that she had recent CT chest which showed pulmonary nodule but no evidence for pulmonary mass.  She denies history of DVT/PE.  No new leg pain or leg swelling today.  Currently she states that she is having some sinus pressure but denies any chest pain or difficulty in breathing or ongoing cough. ? ?HPI ? ?  ? ?Home Medications ?Prior to Admission medications   ?Medication Sig Start Date End Date Taking? Authorizing Provider  ?acyclovir (ZOVIRAX) 400 MG tablet Take 400 mg by mouth daily.    [provider]  ?albuterol (PROVENTIL HFA;VENTOLIN HFA) 108 (90 BASE) MCG/ACT inhaler Inhale 1-2 puffs into the lungs every 6 (six) hours as needed for wheezing or shortness of breath. 02/19/14   Debby Freiberg, MD  ?atorvastatin (LIPITOR) 10 MG tablet Take 10 mg by mouth daily.    [provider]  ?azithromycin (ZITHROMAX Z-PAK) 250 MG tablet Take 2 tabs PO day 1 followed by 1 tab PO daily x 4 days 02/19/14   Debby Freiberg, MD  ?azithromycin (ZITHROMAX) 250 MG tablet Take 1 tablet (250 mg total) by mouth daily. Take first 2 tablets together, then 1 every day until finished. 08/17/16   Forde Dandy, MD  ?busPIRone (BUSPAR) 10 MG tablet Take 20 mg by mouth 3 (three) times  daily.    [provider]  ?diclofenac (VOLTAREN) 50 MG EC tablet Take 50 mg by mouth 3 (three) times daily.    [provider]  ?fesoterodine (TOVIAZ) 8 MG TB24 tablet Take 8 mg by mouth daily.    [provider]  ?ibuprofen (ADVIL,MOTRIN) 800 MG tablet Take 800 mg by mouth every 8 (eight) hours as needed.    [provider]  ?lisinopril (PRINIVIL,ZESTRIL) 20 MG tablet Take 20 mg by mouth daily.    [provider]  ?metFORMIN (GLUCOPHAGE) 500 MG tablet Take 500 mg by mouth 2 (two) times daily with a meal.    [provider]  ?oxybutynin (DITROPAN-XL) 5 MG 24 hr tablet Take 5 mg by mouth at bedtime.    [provider]  ?oxyCODONE-acetaminophen (PERCOCET/ROXICET) 5-325 MG tablet Take 1-2 tablets by mouth every 6 (six) hours as needed for severe pain. 01/11/18   Hayden Rasmussen, MD  ?predniSONE (DELTASONE) 10 MG tablet Take 5 tablets (50 mg total) by mouth daily. 08/18/16   Forde Dandy, MD  ?predniSONE (DELTASONE) 50 MG tablet Take 1 tablet (50 mg total) by mouth daily. 02/19/14   Debby Freiberg, MD  ?silver sulfADIAZINE (SILVADENE) 1 % cream Apply 1 application topically daily. 01/11/18   Hayden Rasmussen, MD  ?   ? ?Allergies    ?Patient has no known  allergies.   ? ?Review of Systems   ?Review of Systems  ?Constitutional:  Negative for chills and fever.  ?HENT:  Negative for ear pain and sore throat.   ?Eyes:  Negative for pain and visual disturbance.  ?Respiratory:  Positive for cough. Negative for shortness of breath.   ?Cardiovascular:  Negative for chest pain and palpitations.  ?Gastrointestinal:  Negative for abdominal pain and vomiting.  ?Genitourinary:  Negative for dysuria and hematuria.  ?Musculoskeletal:  Negative for arthralgias and back pain.  ?Skin:  Negative for color change and rash.  ?Neurological:  Negative for seizures and syncope.  ?All other systems reviewed and are negative. ? ?Physical Exam ?Updated Vital Signs ?BP (!) 142/82    Pulse 72   Temp 99 ?F (37.2 ?C) (Oral)   Resp 17   SpO2 95%  ?Physical Exam ?Vitals and nursing note reviewed.  ?Constitutional:   ?   General: She is not in acute distress. ?   Appearance: She is well-developed.  ?HENT:  ?   Head: Normocephalic and atraumatic.  ?Eyes:  ?   Conjunctiva/sclera: Conjunctivae normal.  ?Cardiovascular:  ?   Rate and Rhythm: Normal rate and regular rhythm.  ?   Heart sounds: No murmur heard. ?Pulmonary:  ?   Effort: Pulmonary effort is normal. No respiratory distress.  ?   Breath sounds: Normal breath sounds.  ?Abdominal:  ?   Palpations: Abdomen is soft.  ?   Tenderness: There is no abdominal tenderness.  ?Musculoskeletal:     ?   General: No swelling.  ?   Cervical back: Neck supple.  ?Skin: ?   General: Skin is warm and dry.  ?   Capillary Refill: Capillary refill takes less than 2 seconds.  ?Neurological:  ?   Mental Status: She is alert.  ?Psychiatric:     ?   Mood and Affect: Mood normal.  ? ? ?ED Results / Procedures / Treatments   ?Labs ?(all labs ordered are listed, but only abnormal results are displayed) ?Labs Reviewed - No data to display ? ?EKG ?None ? ?Radiology ?DG Chest 2 View ? ?Result Date: 09/04/2021 ?CLINICAL DATA:  A 66 year old female presents for evaluation of cough. EXAM: CHEST - 2 VIEW COMPARISON:  January 10 22. FINDINGS: The heart size and mediastinal contours are within normal limits. Both lungs are clear. The visualized skeletal structures are unremarkable. IMPRESSION: No active cardiopulmonary disease. Electronically Signed   By: Zetta Bills M.D.   On: 09/04/2021 14:59   ? ?Procedures ?Procedures  ? ? ?Medications Ordered in ED ?Medications - No data to display ? ?ED Course/ Medical Decision Making/ A&P ?  ?                        ?Medical Decision Making ?Amount and/or Complexity of Data Reviewed ?Radiology: ordered. ? ? ?66 year old lady presents to ER due to concern for possible blood in cough.  Patient had prolonged coughing spell that may have been  related to aspirating one of her vitamins this afternoon.  In ER she had no further episodes and has no chest pain or difficulty breathing.  She appears well in no distress.  Apparently the pill may have also been somewhat red in color.  I independently reviewed the CXR.  No acute findings.  Given patient does not have any ongoing cardiorespiratory complaints and has normal vital signs, do not feel she needs further testing at this time such as D-dimer or CT  chest or labs.  I had lengthy discussion with patient and daughter however that should she develop any recurrent episodes of blood in cough or develop any sort of chest pain or difficulty in breathing she must return to ER for additional testing at that time.  I recommended following up with her pulmonologist about this event.  Patient demonstrated good understanding of these very strict return precautions but again given that she has no symptoms right now and appears well and suspect this may be an isolated event, feel she is stable to dc. ? ? ? ?After the discussed management above, the patient was determined to be safe for discharge.  The patient was in agreement with this plan and all questions regarding their care were answered.  ED return precautions were discussed and the patient will return to the ED with any significant worsening of condition. ? ? ? ? ? ? ?Final Clinical Impression(s) / ED Diagnoses ?Final diagnoses:  ?Acute cough  ? ? ?Rx / DC Orders ?ED Discharge Orders   ? ? None  ? ?  ? ? ?  ?Lucrezia Starch, MD ?09/04/21 1612 ? ?

## 2021-09-04 NOTE — Discharge Instructions (Signed)
Please follow-up with your pulmonology specialist regarding this episode of possible blood in your cough. ? ?If you have any additional episodes of blood in cough, if you have any chest pain or difficulty in breathing or any other new concerning symptom, please return immediately to ER for reassessment. ?

## 2021-09-04 NOTE — ED Triage Notes (Addendum)
Pt reports cough x 30 minutes after taking her medication. Pt also reports coughing up blood. Denies pain at this time. Pt does smoke e-cigarettes.  ?

## 2022-01-25 ENCOUNTER — Emergency Department (HOSPITAL_BASED_OUTPATIENT_CLINIC_OR_DEPARTMENT_OTHER): Payer: Medicare Other

## 2022-01-25 ENCOUNTER — Emergency Department (HOSPITAL_BASED_OUTPATIENT_CLINIC_OR_DEPARTMENT_OTHER)
Admission: EM | Admit: 2022-01-25 | Discharge: 2022-01-25 | Disposition: A | Discharge status: Home / Self Care | Payer: Medicare Other | Attending: Emergency Medicine | Admitting: Emergency Medicine

## 2022-01-25 ENCOUNTER — Other Ambulatory Visit: Payer: Self-pay

## 2022-01-25 ENCOUNTER — Encounter (HOSPITAL_BASED_OUTPATIENT_CLINIC_OR_DEPARTMENT_OTHER): Payer: Self-pay

## 2022-01-25 DIAGNOSIS — J449 Chronic obstructive pulmonary disease, unspecified: Secondary | ICD-10-CM | POA: Diagnosis not present

## 2022-01-25 DIAGNOSIS — Z20822 Contact with and (suspected) exposure to covid-19: Secondary | ICD-10-CM | POA: Insufficient documentation

## 2022-01-25 DIAGNOSIS — R059 Cough, unspecified: Secondary | ICD-10-CM | POA: Diagnosis present

## 2022-01-25 DIAGNOSIS — Z7984 Long term (current) use of oral hypoglycemic drugs: Secondary | ICD-10-CM | POA: Diagnosis not present

## 2022-01-25 DIAGNOSIS — J069 Acute upper respiratory infection, unspecified: Secondary | ICD-10-CM

## 2022-01-25 DIAGNOSIS — E119 Type 2 diabetes mellitus without complications: Secondary | ICD-10-CM | POA: Insufficient documentation

## 2022-01-25 LAB — SARS CORONAVIRUS 2 BY RT PCR: SARS Coronavirus 2 by RT PCR: NEGATIVE

## 2022-01-25 MED ORDER — BENZONATATE 100 MG PO CAPS: 100.0000 mg | ORAL_CAPSULE | Freq: Three times a day (TID) | ORAL | 0 refills | Status: AC

## 2022-01-25 MED ORDER — METHYLPREDNISOLONE 4 MG PO TBPK: ORAL_TABLET | ORAL | 0 refills | Status: AC

## 2022-01-25 MED ORDER — ALBUTEROL SULFATE HFA 108 (90 BASE) MCG/ACT IN AERS
2.0000 | INHALATION_SPRAY | Freq: Once | RESPIRATORY_TRACT | Status: AC
  Administered 2022-01-25: 2 via RESPIRATORY_TRACT
  Filled 2022-01-25: qty 6.7

## 2022-01-25 NOTE — Discharge Instructions (Signed)
Your COVID-19 PCR testing was negative.  Your lungs are clear and your chest x-ray showed no evidence of pneumonia.  Your symptoms are consistent with likely viral upper respiratory infection.  Given your history of COPD, recommend continued use of albuterol as needed for wheezing.  We will discharge you on a course of steroids.

## 2022-01-25 NOTE — ED Provider Notes (Signed)
[facility] EMERGENCY DEPARTMENT Provider Note   CSN: [id number] Arrival date & time: 01/25/22  2202     History  Chief Complaint  Patient presents with   Cough   Hoarse    Rhonda Mckee is a 66 y.o. female.   Cough Associated symptoms: shortness of breath      66 year old female with medical history significant for DM 2, hepatitis C, COPD, herpes, fibromyalgia who presents to the emergency department with roughly 3 days of mildly productive cough and sore throat.  The patient states that she is been taking over-the-counter Mucinex.  She denies any fevers or chills.  She endorses some sore throat which has since improved.  She is tolerating oral intake.  She endorses some mild wheezing.  She endorses mild shortness of breath.  She denies any difficulty swallowing or difficulty opening her mouth.  Home Medications Prior to Admission medications   Medication Sig Start Date End Date Taking? Authorizing Provider  benzonatate (TESSALON) 100 MG capsule Take 1 capsule (100 mg total) by mouth every 8 (eight) hours. 01/25/22  Yes Ernie Avena, MD  busPIRone (BUSPAR) 10 MG tablet Take 20 mg by mouth 3 (three) times daily.   Yes [provider]  fesoterodine (TOVIAZ) 8 MG TB24 tablet Take 8 mg by mouth daily.   Yes [provider]  lisinopril (PRINIVIL,ZESTRIL) 20 MG tablet Take 20 mg by mouth daily.   Yes [provider]  metFORMIN (GLUCOPHAGE) 500 MG tablet Take 500 mg by mouth 2 (two) times daily with a meal.   Yes [provider]  methylPREDNISolone (MEDROL DOSEPAK) 4 MG TBPK tablet Take as instructed on the box 01/25/22  Yes Ernie Avena, MD  oxybutynin (DITROPAN-XL) 5 MG 24 hr tablet Take 5 mg by mouth at bedtime.   Yes [provider]  acyclovir (ZOVIRAX) 400 MG tablet Take 400 mg by mouth daily.    [provider]  albuterol (PROVENTIL HFA;VENTOLIN HFA) 108 (90 BASE) MCG/ACT inhaler Inhale 1-2 puffs into the lungs  every 6 (six) hours as needed for wheezing or shortness of breath. 02/19/14   Mirian Mo, MD  atorvastatin (LIPITOR) 10 MG tablet Take 10 mg by mouth daily.    [provider]  diclofenac (VOLTAREN) 50 MG EC tablet Take 50 mg by mouth 3 (three) times daily.    [provider]  ibuprofen (ADVIL,MOTRIN) 800 MG tablet Take 800 mg by mouth every 8 (eight) hours as needed.    [provider]  silver sulfADIAZINE (SILVADENE) 1 % cream Apply 1 application topically daily. 01/11/18   Terrilee Files, MD      Allergies    Patient has no known allergies.    Review of Systems   Review of Systems  Respiratory:  Positive for cough and shortness of breath.   All other systems reviewed and are negative.   Physical Exam Updated Vital Signs BP 103/81   Pulse 80   Temp 98.7 F (37.1 C)   Resp 16   Ht 5\' 2"  (1.575 m)   Wt 80.3 kg   SpO2 100%   BMI 32.37 kg/m  Physical Exam Vitals and nursing note reviewed.  Constitutional:      General: She is not in acute distress.    Appearance: She is well-developed.  HENT:     Head: Normocephalic and atraumatic.     Jaw: No trismus, tenderness or swelling.     Mouth/Throat:     Pharynx: No oropharyngeal exudate or  posterior oropharyngeal erythema.     Tonsils: No tonsillar exudate or tonsillar abscesses.  Eyes:     Conjunctiva/sclera: Conjunctivae normal.  Cardiovascular:     Rate and Rhythm: Normal rate and regular rhythm.  Pulmonary:     Effort: Pulmonary effort is normal. No respiratory distress.     Breath sounds: Normal breath sounds.  Abdominal:     Palpations: Abdomen is soft.     Tenderness: There is no abdominal tenderness.  Musculoskeletal:        General: No swelling.     Cervical back: Neck supple.  Skin:    General: Skin is warm and dry.     Capillary Refill: Capillary refill takes less than 2 seconds.  Neurological:     Mental Status: She is alert.  Psychiatric:        Mood and Affect: Mood  normal.     ED Results / Procedures / Treatments   Labs (all labs ordered are listed, but only abnormal results are displayed) Labs Reviewed  SARS CORONAVIRUS 2 BY RT PCR    EKG None  Radiology DG Chest Port 1 View  Result Date: 01/25/2022 CLINICAL DATA:  10031; cough and hoarseness since 3 days EXAM: PORTABLE CHEST 1 VIEW COMPARISON:  September 04, 2021 FINDINGS: The heart size and mediastinal contours are within normal limits. Both lungs are clear and stable. The visualized skeletal structures are unremarkable. IMPRESSION: No active disease. Electronically Signed   By: Marjo Bicker M.D.   On: 01/25/2022 09:17    Procedures Procedures    Medications Ordered in ED Medications  albuterol (VENTOLIN HFA) 108 (90 Base) MCG/ACT inhaler 2 puff (2 puffs Inhalation Given 01/25/22 2706)    ED Course/ Medical Decision Making/ A&P                           Medical Decision Making Amount and/or Complexity of Data Reviewed Radiology: ordered.  Risk Prescription drug management.    66 year old female with medical history significant for DM 2, hepatitis C, COPD, herpes, fibromyalgia who presents to the emergency department with roughly 3 days of mildly productive cough and sore throat.  The patient states that she is been taking over-the-counter Mucinex.  She denies any fevers or chills.  She endorses some sore throat which has since improved.  She is tolerating oral intake.  She endorses some mild wheezing.  She endorses mild shortness of breath.  She denies any difficulty swallowing or difficulty opening her mouth.  On arrival, the patient was vitally stable, afebrile, not tachypneic.  Physical exam significant for a well-appearing patient in no apparent distress.  No significant tonsillar erythema or swelling.  Good range of motion of the neck, low suspicion for PTA or RPA.  Patient presenting with signs and symptoms consistent with likely viral URI.  On my exam, the patient is  well-appearing and well-hydrated.  The patient's lungs are clear to auscultation bilaterally. Additionally, the patient has a soft/non-tender abdomen, clear tympanic membranes, and no oropharyngeal exudates.  There are no signs of meningismus.  I see no signs of an acute bacterial infection.  The patient's presentation is most consistent with a viral upper respiratory infection.  I have a low suspicion for pneumonia as the patient's cough has been non-productive and the patient is neither tachypneic nor hypoxic on room air.  Additionally, the patient is CTAB.  19 PCR testing was collected and resulted negative.  A chest x-ray was performed  and revealed no acute cardiac or pulmonary abnormality.  No focal airspace disease to suggest pneumonia.  No pneumothorax.  Patient symptoms are consistent with likely viral URI.  I discussed symptomatic management, including hydration, motrin, and tylenol. The patient/family felt safe being discharged from the ED.  They agreed to followup with the PCP if needed.  I provided ED return precautions.   Final Clinical Impression(s) / ED Diagnoses Final diagnoses:  Viral URI with cough    Rx / DC Orders ED Discharge Orders          Ordered    methylPREDNISolone (MEDROL DOSEPAK) 4 MG TBPK tablet        01/25/22 0928    benzonatate (TESSALON) 100 MG capsule  Every 8 hours        01/25/22 5852              Ernie Avena, MD 01/25/22 1124

## 2022-01-25 NOTE — ED Triage Notes (Signed)
Pt reports cough and hoarseness for 3 days. Taking OTC mucinex\. Denies fever . Started with throat pain which has resolved

## 2022-02-26 IMAGING — CR DG CHEST 2V
2 series · 2 of 2 positions shown · non-contrast
Comparison: Multiple priors including chest CT January 18, 2020 and
chest radiograph May 04, 2018.

CLINICAL DATA: Chest pain and shortness of breath

EXAM:
CHEST - 2 VIEW

[w chest pa]
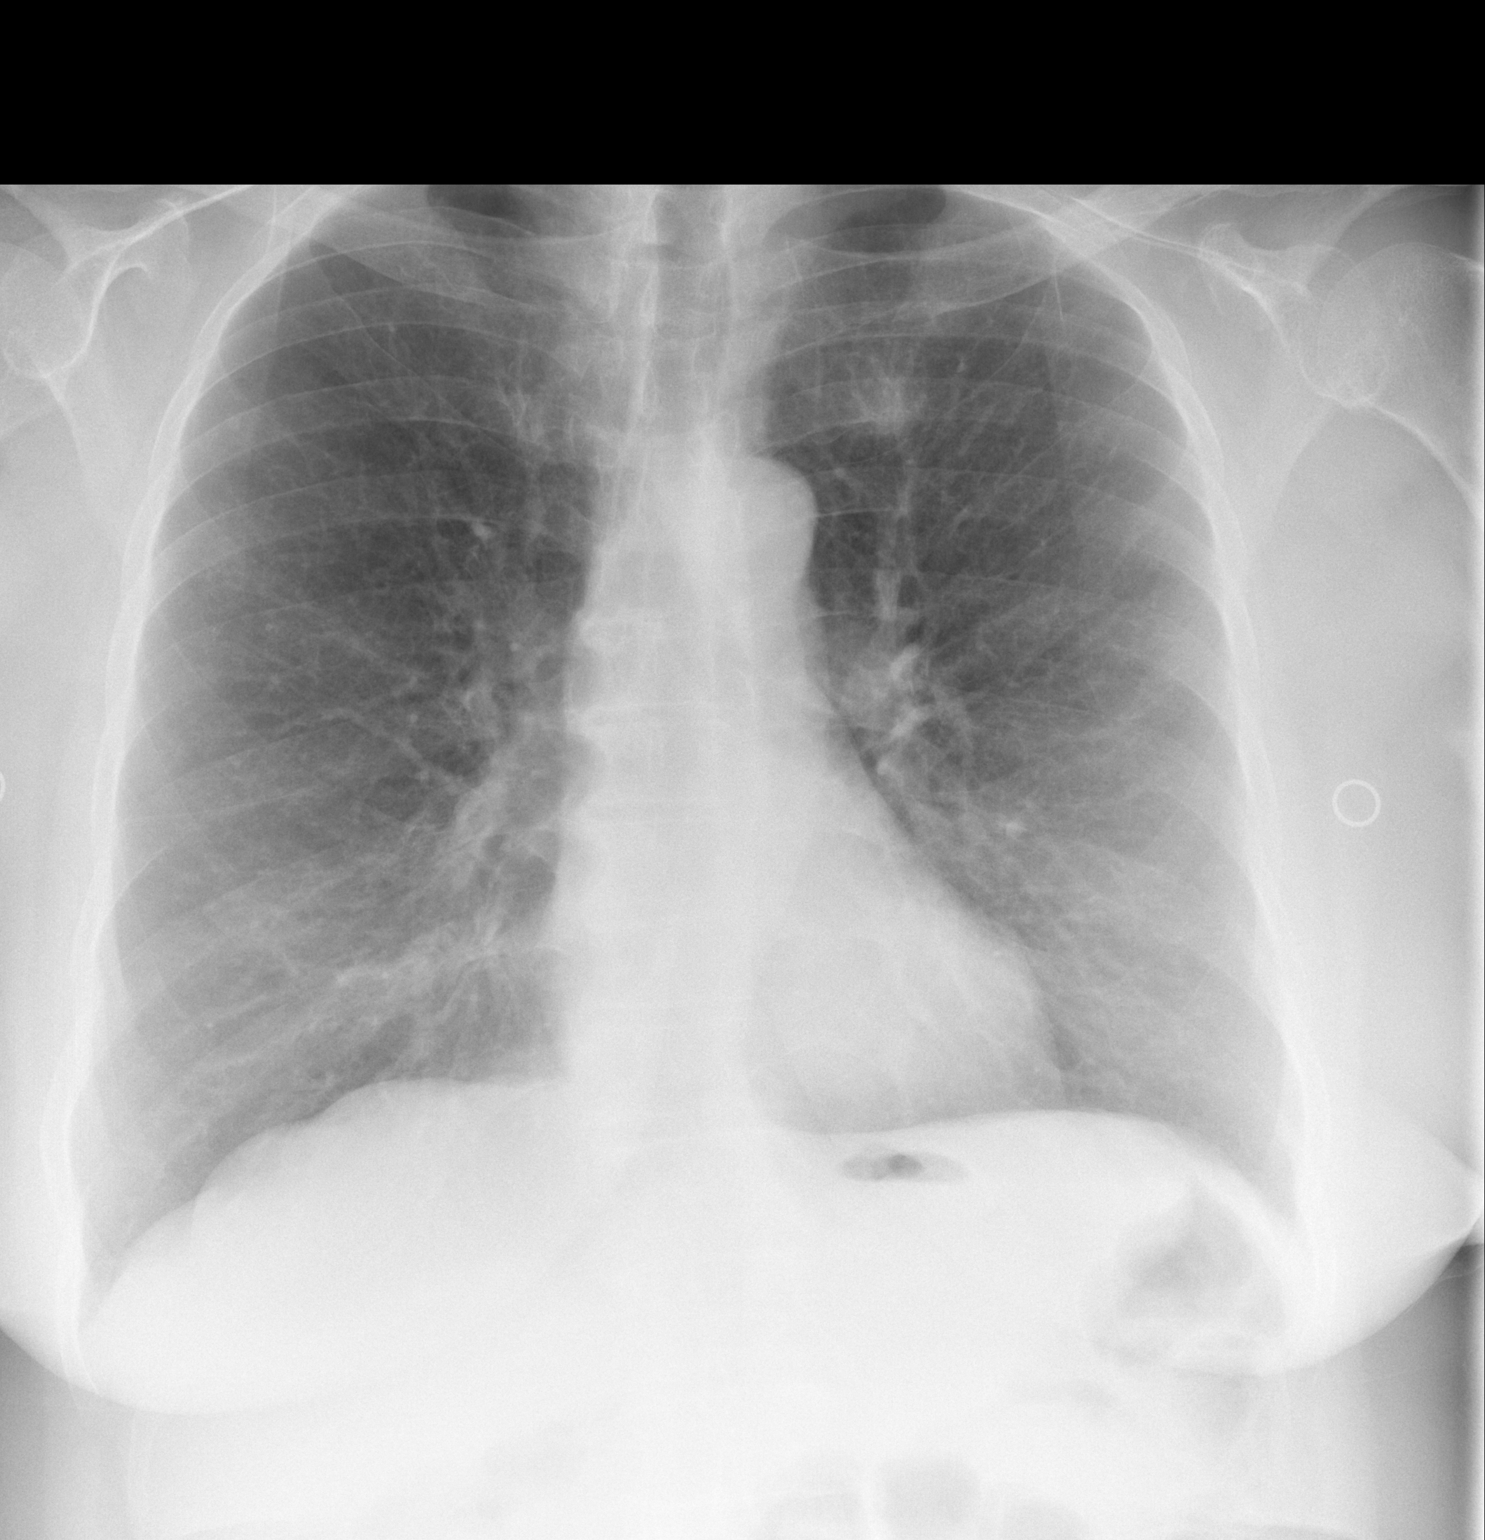

[w chest lat]
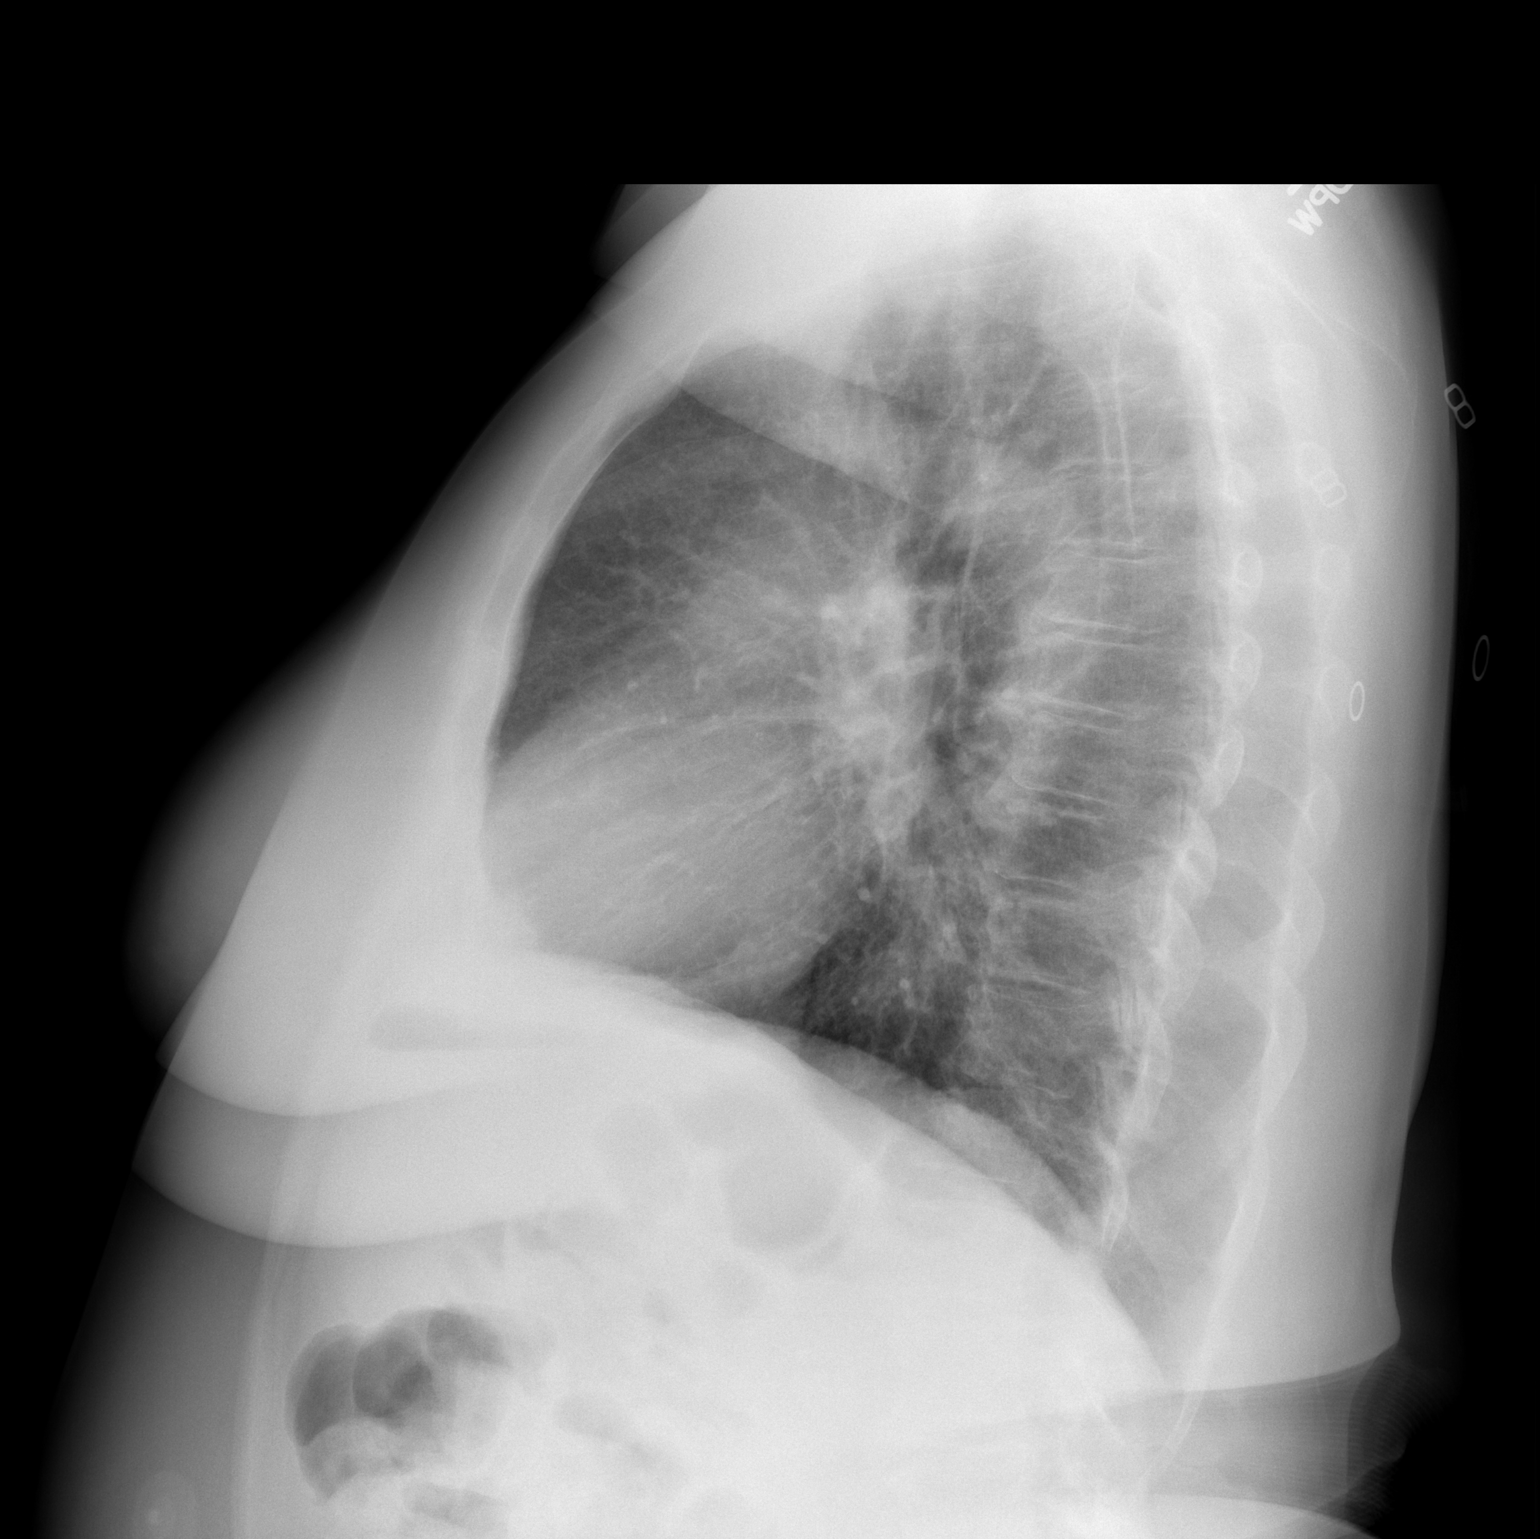

[2 of 2 positions shown; findings below may reference images not displayed]

FINDINGS: The heart size and mediastinal contours are within normal limits.
Lung nodules previously described on prior chest CT are not well
visualized on today's exam. Chronic parenchymal lung changes without
focal consolidation or overt pulmonary edema. No pleural effusion or
pneumothorax. Thoracolumbar spondylosis.
IMPRESSION: No acute cardiopulmonary disease.

## 2023-03-25 ENCOUNTER — Telehealth (HOSPITAL_BASED_OUTPATIENT_CLINIC_OR_DEPARTMENT_OTHER): Payer: Self-pay | Admitting: Emergency Medicine

## 2023-03-25 ENCOUNTER — Emergency Department (HOSPITAL_BASED_OUTPATIENT_CLINIC_OR_DEPARTMENT_OTHER)
Admission: EM | Admit: 2023-03-25 | Discharge: 2023-03-25 | Disposition: A | Discharge status: Home / Self Care | Payer: 59 | Attending: Emergency Medicine | Admitting: Emergency Medicine

## 2023-03-25 ENCOUNTER — Encounter (HOSPITAL_BASED_OUTPATIENT_CLINIC_OR_DEPARTMENT_OTHER): Payer: Self-pay

## 2023-03-25 ENCOUNTER — Other Ambulatory Visit: Payer: Self-pay

## 2023-03-25 DIAGNOSIS — X58XXXA Exposure to other specified factors, initial encounter: Secondary | ICD-10-CM | POA: Insufficient documentation

## 2023-03-25 DIAGNOSIS — S01512A Laceration without foreign body of oral cavity, initial encounter: Secondary | ICD-10-CM | POA: Diagnosis present

## 2023-03-25 MED ORDER — LIDOCAINE VISCOUS HCL 2 % MT SOLN: 5.0000 mL | Freq: Four times a day (QID) | OROMUCOSAL | 0 refills | Status: DC | PRN

## 2023-03-25 MED ORDER — LIDOCAINE VISCOUS HCL 2 % MT SOLN: 5.0000 mL | Freq: Four times a day (QID) | OROMUCOSAL | 0 refills | Status: AC | PRN

## 2023-03-25 NOTE — ED Provider Notes (Signed)
[place] [facility] DEPARTMENT AT [facility] Provider Note   CSN: [id number] Arrival date & time: 03/25/23  1712     History  Chief Complaint  Patient presents with   Mouth Lesions    Rhonda Mckee is a 67 y.o. female.  67 year old female presents today for concern of micro lacerations on the right side of her mouth.  She does wear dentures.  These dentures have metal wires.  The laceration to line up with these wires..  Present over the past few days.  Has not seen her dentist.  Has not taken anything prior to arrival.  No other complaints.  The history is provided by the patient. No language interpreter was used.       Home Medications Prior to Admission medications   Medication Sig Start Date End Date Taking? Authorizing Provider  magic mouthwash (lidocaine, diphenhydrAMINE, alum & mag hydroxide) suspension Swish and spit 5 mLs 4 (four) times daily as needed for mouth pain. 03/25/23  Yes Yerick Eggebrecht, PA-C  acyclovir (ZOVIRAX) 400 MG tablet Take 400 mg by mouth daily.    [provider]  albuterol (PROVENTIL HFA;VENTOLIN HFA) 108 (90 BASE) MCG/ACT inhaler Inhale 1-2 puffs into the lungs every 6 (six) hours as needed for wheezing or shortness of breath. 02/19/14   Mirian Mo, MD  atorvastatin (LIPITOR) 10 MG tablet Take 10 mg by mouth daily.    [provider]  benzonatate (TESSALON) 100 MG capsule Take 1 capsule (100 mg total) by mouth every 8 (eight) hours. 01/25/22   Ernie Avena, MD  busPIRone (BUSPAR) 10 MG tablet Take 20 mg by mouth 3 (three) times daily.    [provider]  diclofenac (VOLTAREN) 50 MG EC tablet Take 50 mg by mouth 3 (three) times daily.    [provider]  fesoterodine (TOVIAZ) 8 MG TB24 tablet Take 8 mg by mouth daily.    [provider]  ibuprofen (ADVIL,MOTRIN) 800 MG tablet Take 800 mg by mouth every 8 (eight) hours as needed.    [provider]  lisinopril (PRINIVIL,ZESTRIL) 20  MG tablet Take 20 mg by mouth daily.    [provider]  metFORMIN (GLUCOPHAGE) 500 MG tablet Take 500 mg by mouth 2 (two) times daily with a meal.    [provider]  methylPREDNISolone (MEDROL DOSEPAK) 4 MG TBPK tablet Take as instructed on the box 01/25/22   Ernie Avena, MD  oxybutynin (DITROPAN-XL) 5 MG 24 hr tablet Take 5 mg by mouth at bedtime.    [provider]  silver sulfADIAZINE (SILVADENE) 1 % cream Apply 1 application topically daily. 01/11/18   Terrilee Files, MD      Allergies    Patient has no known allergies.    Review of Systems   Review of Systems  Constitutional:  Negative for fever.  HENT:  Positive for dental problem. Negative for trouble swallowing and voice change.   Neurological:  Negative for light-headedness.  All other systems reviewed and are negative.   Physical Exam Updated Vital Signs BP (!) 138/96 (BP Location: Left Arm)   Pulse 94   Temp 98.7 F (37.1 C) (Oral)   Resp 18   Ht 5\' 2"  (1.575 m)   Wt 80 kg   SpO2 98%   BMI 32.26 kg/m  Physical Exam Vitals and nursing note reviewed.  Constitutional:      General: She is not in acute distress.    Appearance: Normal appearance. She is not ill-appearing.  HENT:     Head: Normocephalic and atraumatic.     Nose: Nose normal.     Mouth/Throat:     Mouth: Mucous membranes are moist.     Pharynx: No oropharyngeal exudate or posterior oropharyngeal erythema.     Comments: Micro lacerations noted to right side of her upper mouth.  Lines up well with the denture wire.  Too small to require repair. Eyes:     Conjunctiva/sclera: Conjunctivae normal.  Pulmonary:     Effort: Pulmonary effort is normal. No respiratory distress.  Musculoskeletal:        General: No deformity.  Skin:    Findings: No rash.  Neurological:     Mental Status: She is alert.     ED Results / Procedures / Treatments   Labs (all labs ordered are listed, but only abnormal results are  displayed) Labs Reviewed - No data to display  EKG None  Radiology No results found.  Procedures Procedures    Medications Ordered in ED Medications - No data to display  ED Course/ Medical Decision Making/ A&P                                 Medical Decision Making  67 year old female presents today for concern of micro lacerations.  Present for the last few days.  Minor well with the denture wire.  Will give Magic mouthwash.  Discussed following up with a dentist.  Discussed follow-up with PCP.  Discussed with attending.  No evidence of abscess or other concerning findings intraorally.  Final Clinical Impression(s) / ED Diagnoses Final diagnoses:  Laceration of oral cavity, initial encounter    Rx / DC Orders ED Discharge Orders          Ordered    magic mouthwash (lidocaine, diphenhydrAMINE, alum & mag hydroxide) suspension  4 times daily PRN        03/25/23 1845              Marita Kansas, PA-C 03/25/23 1900    Lonell Grandchild, MD 03/25/23 2134

## 2023-03-25 NOTE — ED Notes (Signed)
Pt. Alert and oriented with no distress noted.  Pt. Teaching done about foods.

## 2023-03-25 NOTE — Telephone Encounter (Cosign Needed)
Previous telephone encounter included the incorrect pharmacy.  This is to update pharmacy for

## 2023-03-25 NOTE — Telephone Encounter (Cosign Needed)
Patient states the pharmacy that the medication was sent to does not have the medication in stock and requests a new pharmacy.

## 2023-03-25 NOTE — Discharge Instructions (Signed)
Your exam today showed tiny and superficial laceration in the mouth that lined up with the metal piece on your dentures.  These did not require sutures as a returning and they will heal on their own.  I would recommend having your dentures evaluated by the dentist.  Follow-up with your primary care provider.  In the meantime I have given you Magic mouthwash to help with the discomfort.

## 2023-03-25 NOTE — ED Triage Notes (Signed)
The patient  stated she has holes in the roof of her mouth the last few days. She stated now her ear and face hurt.
